# Patient Record
Sex: Female | Born: 1980 | Race: White | Hispanic: No | Marital: Married | Smoking: Never smoker
Health system: Southern US, Community
[De-identification: ages and names within clinical notes are randomized; demographics above are authoritative.]

## PROBLEM LIST (undated history)

## (undated) DIAGNOSIS — J45909 Unspecified asthma, uncomplicated: Secondary | ICD-10-CM

## (undated) DIAGNOSIS — F419 Anxiety disorder, unspecified: Secondary | ICD-10-CM

## (undated) HISTORY — DX: Unspecified asthma, uncomplicated: J45.909

## (undated) HISTORY — DX: Anxiety disorder, unspecified: F41.9

---
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
## 2017-09-26 LAB — HM PAP SMEAR

## 2017-11-03 ENCOUNTER — Encounter: Payer: Self-pay | Admitting: Physician Assistant

## 2017-11-03 ENCOUNTER — Ambulatory Visit: Payer: 59 | Admitting: Physician Assistant

## 2017-11-03 VITALS — BP 131/85 | HR 78 | Resp 14 | Ht 65.5 in | Wt 144.0 lb

## 2017-11-03 DIAGNOSIS — F418 Other specified anxiety disorders: Secondary | ICD-10-CM | POA: Diagnosis not present

## 2017-11-03 DIAGNOSIS — Z7689 Persons encountering health services in other specified circumstances: Secondary | ICD-10-CM

## 2017-11-03 DIAGNOSIS — J45990 Exercise induced bronchospasm: Secondary | ICD-10-CM | POA: Diagnosis not present

## 2017-11-03 DIAGNOSIS — F951 Chronic motor or vocal tic disorder: Secondary | ICD-10-CM

## 2017-11-03 MED ORDER — ALBUTEROL SULFATE HFA 108 (90 BASE) MCG/ACT IN AERS: 1.0000 | INHALATION_SPRAY | RESPIRATORY_TRACT | 5 refills | Status: DC | PRN

## 2017-11-03 MED ORDER — BUSPIRONE HCL 7.5 MG PO TABS: 7.5000 mg | ORAL_TABLET | Freq: Three times a day (TID) | ORAL | 1 refills | Status: DC | PRN

## 2017-11-03 NOTE — Patient Instructions (Addendum)
For anxiety: - Buspar can be taken every 8 hours as needed or you can take it on a scheduled basis three times daily - You can continue Melatonin 3-6 mg at bedtime as needed. Alternatively you can do Diphenhydramine (Benadryl) 25-50 mg at bedtime as needed. Send me a MyChart message in 2-4 weeks to let me know how you are doing on the medication  Counseling Options: - psychologytoday.com: search engine to locate local counselors - Family Services in your county offer counseling on a sliding scale (pay what you can afford) - [facility]: we can place a referral for you to see one of licensed counselors in [place], [place], or [place] - online counseling: BetterHelp and [employer] (not covered by insurance, but affordable self-pay rates)  Other resources: - [place].ateverydayhealth.com/depression/guide/resources/ - [employer] - https://malone.com/greatist.com/grow/resources-when-you-can-not-afford-therapy  Safety Plan: if having self-harm or suicidal thoughts Our Office 970-091-6445(847)205-1791 [facility] Crisis Hotline 843-817-7745310-515-4095 National Suicide Hotline 1-800-SUICIDE If in immediate danger of harming yourself, go to the nearest emergency room or call 911   Living With Anxiety After being diagnosed with an anxiety disorder, you may be relieved to know why you have felt or behaved a certain way. It is natural to also feel overwhelmed about the treatment ahead and what it will mean for your life. With care and support, you can manage this condition and recover from it. How to cope with anxiety Dealing with stress Stress is your body's reaction to life changes and events, both good and bad. Stress can last just a few hours or it can be ongoing. Stress can play a major role in anxiety, so it is important to learn both how to cope with stress and how to think about it differently. Talk with your health care provider or a counselor to learn more about stress reduction. He or she may suggest some stress  reduction techniques, such as:  Music therapy. This can include creating or listening to music that you enjoy and that inspires you.  Mindfulness-based meditation. This involves being aware of your normal breaths, rather than trying to control your breathing. It can be done while sitting or walking.  Centering prayer. This is a kind of meditation that involves focusing on a word, phrase, or sacred image that is meaningful to you and that brings you peace.  Deep breathing. To do this, expand your stomach and inhale slowly through your nose. Hold your breath for 3-5 seconds. Then exhale slowly, allowing your stomach muscles to relax.  Self-talk. This is a skill where you identify thought patterns that lead to anxiety reactions and correct those thoughts.  Muscle relaxation. This involves tensing muscles then relaxing them.  Choose a stress reduction technique that fits your lifestyle and personality. Stress reduction techniques take time and practice. Set aside 5-15 minutes a day to do them. Therapists can offer training in these techniques. The training may be covered by some insurance plans. Other things you can do to manage stress include:  Keeping a stress diary. This can help you learn what triggers your stress and ways to control your response.  Thinking about how you respond to certain situations. You may not be able to control everything, but you can control your reaction.  Making time for activities that help you relax, and not feeling guilty about spending your time in this way.  Therapy combined with coping and stress-reduction skills provides the best chance for successful treatment. Medicines Medicines can help ease symptoms. Medicines for anxiety include:  Anti-anxiety  drugs.  Antidepressants.  Beta-blockers.  Medicines may be used as the main treatment for anxiety disorder, along with therapy, or if other treatments are not working. Medicines should be prescribed by a  health care provider. Relationships Relationships can play a big part in helping you recover. Try to spend more time connecting with trusted friends and family members. Consider going to couples counseling, taking family education classes, or going to family therapy. Therapy can help you and others better understand the condition. How to recognize changes in your condition Everyone has a different response to treatment for anxiety. Recovery from anxiety happens when symptoms decrease and stop interfering with your daily activities at home or work. This may mean that you will start to:  Have better concentration and focus.  Sleep better.  Be less irritable.  Have more energy.  Have improved memory.  It is important to recognize when your condition is getting worse. Contact your health care provider if your symptoms interfere with home or work and you do not feel like your condition is improving. Where to find help and support: You can get help and support from these sources:  Self-help groups.  Online and [employer].  A trusted spiritual leader.  Couples counseling.  Family education classes.  Family therapy.  Follow these instructions at home:  Eat a healthy diet that includes plenty of vegetables, fruits, whole grains, low-fat dairy products, and lean protein. Do not eat a lot of foods that are high in solid fats, added sugars, or salt.  Exercise. Most adults should do the following: ? Exercise for at least 150 minutes each week. The exercise should increase your heart rate and make you sweat (moderate-intensity exercise). ? Strengthening exercises at least twice a week.  Cut down on caffeine, tobacco, alcohol, and other potentially harmful substances.  Get the right amount and quality of sleep. Most adults need 7-9 hours of sleep each night.  Make choices that simplify your life.  Take over-the-counter and prescription medicines only as told by your health  care provider.  Avoid caffeine, alcohol, and certain over-the-counter cold medicines. These may make you feel worse. Ask your pharmacist which medicines to avoid.  Keep all follow-up visits as told by your health care provider. This is important. Questions to ask your health care provider  Would I benefit from therapy?  How often should I follow up with a health care provider?  How long do I need to take medicine?  Are there any long-term side effects of my medicine?  Are there any alternatives to taking medicine? Contact a health care provider if:  You have a hard time staying focused or finishing daily tasks.  You spend many hours a day feeling worried about everyday life.  You become exhausted by worry.  You start to have headaches, feel tense, or have nausea.  You urinate more than normal.  You have diarrhea. Get help right away if:  You have a racing heart and shortness of breath.  You have thoughts of hurting yourself or others. If you ever feel like you may hurt yourself or others, or have thoughts about taking your own life, get help right away. You can go to your nearest emergency department or call:  Your local emergency services (911 in the [place]  A suicide crisis helpline, such as the National Suicide Prevention Lifeline at 431-078-8863. This is open 24-hours a day.  Summary  Taking steps to deal with stress can help calm you.  Medicines cannot  cure anxiety disorders, but they can help ease symptoms.  Family, friends, and partners can play a big part in helping you recover from an anxiety disorder. This information is not intended to replace advice given to you by your health care provider. Make sure you discuss any questions you have with your health care provider. Document Released: 07/01/2016 Document Revised: 07/01/2016 Document Reviewed: 07/01/2016 Elsevier Interactive Patient Education  [employer].

## 2017-11-03 NOTE — Progress Notes (Signed)
HPI:                                                                Arnoldo LenisJennifer Clos is a 37 y.o. female who [facility]: Primary Care Sports Medicine today to establish care  Current concerns: asthma, anxiety  Anxiety: currently working full-time and mother of 36 year old son and 2216 month old daughter. Reports feeling overwhelmed, stressed, irritability, and difficulty focusing. Under added stress with the family's business and mother's recent health issues.   Has never taken medication for anxiety. Has tried counseling in the past.  Reports history of a "movement disorder" that does not have a clear diagnosis, had a negative work-up. Includes blinking and upper extremity involuntary movements.  Denies symptoms of mania/hypomania. Denies suicidal thinking. Denies auditory/visual hallucinations.  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 Uses Albuterol prn. Has Symbicort but does not feel she needs controller medication. Denies nocturnal cough. No exacerbations in the last year. No history of hospitalizations.    Depression screen PHQ 2/9 11/03/2017  Decreased Interest 1  Down, Depressed, Hopeless 0  PHQ - 2 Score 1  Altered sleeping 3  Tired, decreased energy 1  Change in appetite 3  Feeling bad or failure about yourself  0  Trouble concentrating 1  Moving slowly or fidgety/restless 0  Suicidal thoughts 0  PHQ-9 Score 9  Difficult doing work/chores Very difficult    GAD 7 : Generalized Anxiety Score 11/03/2017  Nervous, Anxious, on Edge 2  Control/stop worrying 1  Worry too much - different things 2  Trouble relaxing 1  Restless 0  Easily annoyed or irritable 2  Afraid - awful might happen 1  Total GAD 7 Score 9  Anxiety Difficulty Very difficult      Past Medical History:  Diagnosis Date  . Anxiety   . Asthma    Past Surgical History:  Procedure Laterality Date  . BLADDER SURGERY  1991   Social History    Tobacco Use  . Smoking status: Never Smoker  . Smokeless tobacco: Never Used  Substance Use Topics  . Alcohol use: Yes    Alcohol/week: 0.6 oz    Types: 1 Standard drinks or equivalent per week   family history includes Breast cancer in her mother; Heart disease in her father; Hypertension in her mother; Seizures in her sister.    ROS: Review of Systems  Respiratory: Positive for wheezing.   Gastrointestinal:       + hemorrhoids  Musculoskeletal: Positive for back pain.  Endo/Heme/Allergies: Positive for environmental allergies.  Psychiatric/Behavioral: Negative for depression, hallucinations, memory loss, substance abuse and suicidal ideas. The patient is nervous/anxious and has insomnia.   All other systems reviewed and are negative.    Medications: Current Outpatient Medications  Medication Sig Dispense Refill  . albuterol (VENTOLIN HFA) 108 (90 Base) MCG/ACT inhaler Inhale 1-2 puffs into the lungs every 4 (four) hours as needed for wheezing or shortness of breath. 2 Inhaler 5  . budesonide-formoterol (SYMBICORT) 80-4.5 MCG/ACT inhaler Inhale 2 puffs into the lungs 2 (two) times daily.    . Cetirizine HCl (ZYRTEC PO) Zyrtec    . ibuprofen (ADVIL,MOTRIN) 800 MG tablet ibuprofen 800 mg tablet  TAKE 1 TABLET (800 MG TOTAL)  BY MOUTH EVERY 8 (EIGHT) HOURS AS NEEDED FOR PAIN.    . MELATONIN PO Take by mouth.    . Multiple Vitamins-Minerals (MULTIVITAMIN PO) Take by mouth.    . busPIRone (BUSPAR) 7.5 MG tablet Take 1 tablet (7.5 mg total) by mouth 3 (three) times daily as needed. 90 tablet 1   No current facility-administered medications for this visit.    Allergies  Allergen Reactions  . Azithromycin Hives and Rash  . Erythromycin Rash       Objective:  BP 131/85   Pulse 78   Resp 14   Ht 5' 5.5" (1.664 m)   Wt 144 lb (65.3 kg)   SpO2 98%   BMI 23.60 kg/m  Gen:  alert, not ill-appearing, no distress, appropriate for age HEENT: head normocephalic without  obvious abnormality, conjunctiva and cornea clear, trachea midline Pulm: Normal work of breathing, normal phonation Neuro: alert and oriented x 3, no tremor MSK: extremities atraumatic, normal gait and station Skin: intact, no rashes on exposed skin, no jaundice, no cyanosis Psych: well-groomed, cooperative, good eye contact, appears anxious, affect full range, speech is articulate, and thought processes clear and goal-directed    No results found for this or any previous visit (from the past 72 hour(s)). No results found.    Assessment and Plan: 37 y.o. female with     Encounter to establish care - Personally reviewed PMH, PSH, PFH, medications, allergies, HM - Age-appropriate cancer screening: Pap UTD, to be abstracted - Influenza n/a - Tdap UTD  Situational anxiety - GAD7=9, moderate, no acute safety issues. Discussed treatment options. She is interested in something to take as needed that won't be overly sedating. Starting Buspar tid prn.   Exercise-induced asthma - well-controlled  Encounter to establish care  Situational anxiety - Plan: busPIRone (BUSPAR) 7.5 MG tablet  Chronic motor tic  Exercise-induced asthma - Plan: budesonide-formoterol (SYMBICORT) 80-4.5 MCG/ACT inhaler, albuterol (VENTOLIN HFA) 108 (90 Base) MCG/ACT inhaler    Patient education and anticipatory guidance given Patient agrees with treatment plan Follow-up in 3 months or sooner as needed if symptoms worsen or fail to improve  Levonne Hubert PA-C

## 2017-11-04 ENCOUNTER — Encounter: Payer: Self-pay | Admitting: Physician Assistant

## 2017-12-07 ENCOUNTER — Encounter: Payer: Self-pay | Admitting: Physician Assistant

## 2018-04-09 ENCOUNTER — Emergency Department
Admission: EM | Admit: 2018-04-09 | Discharge: 2018-04-09 | Disposition: A | Discharge status: Home / Self Care | Payer: 59 | Source: Home / Self Care | Attending: Family Medicine | Admitting: Family Medicine

## 2018-04-09 ENCOUNTER — Encounter: Payer: Self-pay | Admitting: Emergency Medicine

## 2018-04-09 ENCOUNTER — Other Ambulatory Visit: Payer: Self-pay

## 2018-04-09 DIAGNOSIS — R319 Hematuria, unspecified: Secondary | ICD-10-CM

## 2018-04-09 DIAGNOSIS — G8929 Other chronic pain: Secondary | ICD-10-CM

## 2018-04-09 DIAGNOSIS — M545 Low back pain, unspecified: Secondary | ICD-10-CM

## 2018-04-09 LAB — POCT URINALYSIS DIP (MANUAL ENTRY)
Bilirubin, UA: NEGATIVE
Blood, UA: NEGATIVE
Glucose, UA: NEGATIVE mg/dL
Ketones, POC UA: NEGATIVE mg/dL
Leukocytes, UA: NEGATIVE
Nitrite, UA: NEGATIVE
Protein Ur, POC: NEGATIVE mg/dL
Spec Grav, UA: 1.025 (ref 1.010–1.025)
Urobilinogen, UA: 0.2 E.U./dL
pH, UA: 6 (ref 5.0–8.0)

## 2018-04-09 NOTE — ED Provider Notes (Addendum)
[name]    CSN: [id number] Arrival date & time: 04/09/18  1632     History   Chief Complaint Chief Complaint  Patient presents with  . Back Pain    HPI Samantha Ochoa is a 37 y.o. female.   HPI  Samantha Ochoa is a 37 y.o. female presenting to UC with c/o bilateral lower back pain that started about 2.5 months ago. She has been going to a physical therapist and getting dry needling done, thinking the pain was due to a muscle strain.  She has not seen much improvement with PT but also became concerned today because she had hematuria.  Denies dysuria or urinary frequency. Denies fever, chills, n/v/d. No vaginal symptoms. No hx of kidney stones.   Past Medical History:  Diagnosis Date  . Anxiety   . Asthma     Patient Active Problem List   Diagnosis Date Noted  . Situational anxiety 11/03/2017  . Exercise-induced asthma 11/03/2017  . 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 BLADDER SURGERY  1991    OB History    Gravida  2   Para  2   Term      Preterm      AB      Living  2     SAB      TAB      Ectopic      Multiple      Live Births               Home Medications    Prior to Admission medications   Medication Sig Start Date End Date Taking? Authorizing Provider  albuterol (VENTOLIN HFA) 108 (90 Base) MCG/ACT inhaler Inhale 1-2 puffs into the lungs every 4 (four) hours as needed for wheezing or shortness of breath. 11/03/17   Carlis Stable, PA-C  budesonide-formoterol [facility]) 80-4.5 MCG/ACT inhaler Inhale 2 puffs into the lungs 2 (two) times daily.    [provider]  busPIRone (BUSPAR) 7.5 MG tablet Take 1 tablet (7.5 mg total) by mouth 3 (three) times daily as needed. 11/03/17   Carlis Stable, PA-C  Cetirizine HCl (ZYRTEC PO) Zyrtec    [provider]  ibuprofen (ADVIL,MOTRIN) 800 MG tablet ibuprofen 800 mg tablet  TAKE 1 TABLET  (800 MG TOTAL) BY MOUTH EVERY 8 (EIGHT) HOURS AS NEEDED FOR PAIN.    [provider]  MELATONIN PO Take by mouth.    [provider]  Multiple Vitamins-Minerals (MULTIVITAMIN PO) Take by mouth.    [provider]    Family History Family History  Problem Relation Age of Onset  . Breast cancer Mother   . Hypertension Mother   . Heart disease Father   . Seizures Sister     Social History Social History   Tobacco Use  . Smoking status: Never Smoker  . Smokeless tobacco: Never Used  Substance Use Topics  . Alcohol use: Yes    Alcohol/week: 1.0 standard drinks    Types: 1 Standard drinks or equivalent per week  . Drug use: Never     Allergies   Azithromycin and Erythromycin   Review of Systems Review of Systems  Gastrointestinal: Negative for abdominal pain, diarrhea, nausea and vomiting.  Genitourinary: Positive for hematuria. Negative for dysuria, flank pain, frequency, pelvic pain, urgency, vaginal bleeding and vaginal discharge.  Musculoskeletal: Positive for back pain and myalgias.  Skin: Negative for rash.  Physical Exam Triage Vital Signs ED Triage Vitals  Enc Vitals Group     BP 04/09/18 1657 114/75     Pulse Rate 04/09/18 1657 71     Resp --      Temp 04/09/18 1657 98 F (36.7 C)     Temp Source 04/09/18 1657 Oral     SpO2 04/09/18 1657 98 %     Weight 04/09/18 1659 150 lb (68 kg)     Height 04/09/18 1659 5\' 5"  (1.651 m)     Head Circumference --      Peak Flow --      Pain Score 04/09/18 1658 10     Pain Loc --      Pain Edu? --      Excl. in GC? --    No data found.  Updated Vital Signs BP 114/75 (BP Location: Right Arm)   Pulse 71   Temp 98 F (36.7 C) (Oral)   Ht 5\' 5"  (1.651 m)   Wt 150 lb (68 kg)   SpO2 98%   BMI 24.96 kg/m   Visual Acuity Right Eye Distance:   Left Eye Distance:   Bilateral Distance:    Right Eye Near:   Left Eye Near:    Bilateral Near:     Physical Exam  Constitutional: She  is oriented to person, place, and time. She appears well-developed and well-nourished. No distress.  HENT:  Head: Normocephalic and atraumatic.  Eyes: EOM are normal.  Neck: Normal range of motion.  Cardiovascular: Normal rate.  Pulmonary/Chest: Effort normal.  Abdominal: Soft. She exhibits no distension. There is tenderness in the suprapubic area. There is no CVA tenderness.  Musculoskeletal: Normal range of motion. She exhibits tenderness. She exhibits no edema.  Lower back muscle tenderness. Tenderness to SI joint without step-off or crepitus.  Negative straight leg raise  Neurological: She is alert and oriented to person, place, and time.  Skin: Skin is warm and dry. Capillary refill takes less than 2 seconds. No rash noted. She is not diaphoretic. No erythema.  Psychiatric: She has a normal mood and affect. Her behavior is normal.  Nursing note and vitals reviewed.    UC Treatments / Results  Labs (all labs ordered are listed, but only abnormal results are displayed) Labs Reviewed  URINE CULTURE  POCT URINALYSIS DIP (MANUAL ENTRY)    EKG None  Radiology No results found.  Procedures Procedures (including critical care time)  Medications Ordered in UC Medications - No data to display  Initial Impression / Assessment and Plan / UC Course  I have reviewed the triage vital signs and the nursing notes.  Pertinent labs & imaging results that were available during my care of the patient were reviewed by me and considered in my medical decision making (see chart for details).     UA: normal Culture sent Offered to get plain films but did question if pt could be pregnant prior to getting plain films.  Pt stated unlikely as she does have an IUD. Pt declined pregnancy test and imaging. She would like to learn results of urine culture first. Offered pt Robaxin for pain. Pt declined.  Encouraged f/u with Sports Medicine and PCP.  Final Clinical Impressions(s) / UC  Diagnoses   Final diagnoses:  Hematuria, unspecified type  Chronic bilateral low back pain without sciatica     Discharge Instructions      Please call to schedule a follow up appointment with your family medicine provider and  Sports Medicine next week for further evaluation and treatment of your chronic low back pain and now the blood in your urine.  A urine culture has been sent to check the severity of your urinary infection and to determine if and antibiotic is needed. The results should come back within 2-3 days and you will be notified even if it comes back normal.  Please stay well hydrated and follow up with your family doctor in 1 week if not improving, sooner if worsening.      ED Prescriptions    None     Controlled Substance Prescriptions [place] Controlled Substance Registry consulted? Not Applicable   Rolla Platehelps, Pearle Wandler O, PA-C 04/09/18 1842    Lurene ShadowPhelps, Ura Hausen O, PA-C 04/09/18 1843

## 2018-04-09 NOTE — Discharge Instructions (Signed)
°  Please call to schedule a follow up appointment with your family medicine provider and Sports Medicine next week for further evaluation and treatment of your chronic low back pain and now the blood in your urine.  A urine culture has been sent to check the severity of your urinary infection and to determine if and antibiotic is needed. The results should come back within 2-3 days and you will be notified even if it comes back normal.  Please stay well hydrated and follow up with your family doctor in 1 week if not improving, sooner if worsening.

## 2018-04-09 NOTE — ED Triage Notes (Signed)
LBP x 2.5 months, this am hematuria

## 2018-04-11 LAB — URINE CULTURE
MICRO NUMBER:: 91132929
SPECIMEN QUALITY:: ADEQUATE

## 2018-04-12 ENCOUNTER — Telehealth: Payer: Self-pay | Admitting: Emergency Medicine

## 2018-08-19 ENCOUNTER — Telehealth: Payer: Self-pay | Admitting: Physician Assistant

## 2018-08-19 DIAGNOSIS — J45990 Exercise induced bronchospasm: Secondary | ICD-10-CM

## 2018-08-19 MED ORDER — ALBUTEROL SULFATE HFA 108 (90 BASE) MCG/ACT IN AERS: 1.0000 | INHALATION_SPRAY | RESPIRATORY_TRACT | 0 refills | Status: DC | PRN

## 2018-08-19 NOTE — Telephone Encounter (Signed)
Samantha Ochoa called this afternoon, stating that she needed refill on the albuterol inhaler. She wasn't sure if she needed to schedule an appt for this. I told her that I would send a message  letting you know. She also wanted to know if she could get a second inhaler to carry in her gym bag.

## 2018-08-19 NOTE — Telephone Encounter (Signed)
Refill sent for 2 inhalers Keep f/u appointment for Monday

## 2018-08-19 NOTE — Addendum Note (Signed)
Addended by: Gena Fray E on: 08/19/2018 05:50 PM   Modules accepted: Orders

## 2018-08-19 NOTE — Telephone Encounter (Signed)
Pt notified that she needs an appointment -EH/RMA

## 2018-08-23 ENCOUNTER — Encounter: Payer: Self-pay | Admitting: Physician Assistant

## 2018-08-23 ENCOUNTER — Ambulatory Visit (INDEPENDENT_AMBULATORY_CARE_PROVIDER_SITE_OTHER): Payer: 59 | Admitting: Physician Assistant

## 2018-08-23 VITALS — BP 111/76 | HR 83 | Resp 14 | Wt 155.0 lb

## 2018-08-23 DIAGNOSIS — F419 Anxiety disorder, unspecified: Secondary | ICD-10-CM

## 2018-08-23 DIAGNOSIS — Z803 Family history of malignant neoplasm of breast: Secondary | ICD-10-CM | POA: Insufficient documentation

## 2018-08-23 DIAGNOSIS — J4530 Mild persistent asthma, uncomplicated: Secondary | ICD-10-CM | POA: Diagnosis not present

## 2018-08-23 DIAGNOSIS — Z23 Encounter for immunization: Secondary | ICD-10-CM | POA: Diagnosis not present

## 2018-08-23 MED ORDER — BUDESONIDE-FORMOTEROL FUMARATE 80-4.5 MCG/ACT IN AERO: 2.0000 | INHALATION_SPRAY | Freq: Two times a day (BID) | RESPIRATORY_TRACT | 5 refills | Status: DC

## 2018-08-23 MED ORDER — BUSPIRONE HCL 7.5 MG PO TABS: 7.5000 mg | ORAL_TABLET | Freq: Two times a day (BID) | ORAL | 2 refills | Status: DC

## 2018-08-23 MED ORDER — ALBUTEROL SULFATE HFA 108 (90 BASE) MCG/ACT IN AERS: 1.0000 | INHALATION_SPRAY | RESPIRATORY_TRACT | 5 refills | Status: DC | PRN

## 2018-08-23 NOTE — Patient Instructions (Addendum)
Send a MyChart message in about 2 weeks regarding anxiety symptoms  Plan to follow-up in the office in the next 2 months  Living With Anxiety  After being diagnosed with an anxiety disorder, you may be relieved to know why you have felt or behaved a certain way. It is natural to also feel overwhelmed about the treatment ahead and what it will mean for your life. With care and support, you can manage this condition and recover from it. How to cope with anxiety Dealing with stress Stress is your body's reaction to life changes and events, both good and bad. Stress can last just a few hours or it can be ongoing. Stress can play a major role in anxiety, so it is important to learn both how to cope with stress and how to think about it differently. Talk with your health care provider or a counselor to learn more about stress reduction. He or she may suggest some stress reduction techniques, such as:  Music therapy. This can include creating or listening to music that you enjoy and that inspires you.  Mindfulness-based meditation. This involves being aware of your normal breaths, rather than trying to control your breathing. It can be done while sitting or walking.  Centering prayer. This is a kind of meditation that involves focusing on a word, phrase, or sacred image that is meaningful to you and that brings you peace.  Deep breathing. To do this, expand your stomach and inhale slowly through your nose. Hold your breath for 3-5 seconds. Then exhale slowly, allowing your stomach muscles to relax.  Self-talk. This is a skill where you identify thought patterns that lead to anxiety reactions and correct those thoughts.  Muscle relaxation. This involves tensing muscles then relaxing them. Choose a stress reduction technique that fits your lifestyle and personality. Stress reduction techniques take time and practice. Set aside 5-15 minutes a day to do them. Therapists can offer training in these  techniques. The training may be covered by some insurance plans. Other things you can do to manage stress include:  Keeping a stress diary. This can help you learn what triggers your stress and ways to control your response.  Thinking about how you respond to certain situations. You may not be able to control everything, but you can control your reaction.  Making time for activities that help you relax, and not feeling guilty about spending your time in this way. Therapy combined with coping and stress-reduction skills provides the best chance for successful treatment. Medicines Medicines can help ease symptoms. Medicines for anxiety include:  Anti-anxiety drugs.  Antidepressants.  Beta-blockers. Medicines may be used as the main treatment for anxiety disorder, along with therapy, or if other treatments are not working. Medicines should be prescribed by a health care provider. Relationships Relationships can play a big part in helping you recover. Try to spend more time connecting with trusted friends and family members. Consider going to couples counseling, taking family education classes, or going to family therapy. Therapy can help you and others better understand the condition. How to recognize changes in your condition Everyone has a different response to treatment for anxiety. Recovery from anxiety happens when symptoms decrease and stop interfering with your daily activities at home or work. This may mean that you will start to:  Have better concentration and focus.  Sleep better.  Be less irritable.  Have more energy.  Have improved memory. It is important to recognize when your condition is getting worse.  Contact your health care provider if your symptoms interfere with home or work and you do not feel like your condition is improving. Where to find help and support: You can get help and support from these sources:  Self-help groups.  Online and [employer]  organizations.  A trusted spiritual leader.  Couples counseling.  Family education classes.  Family therapy. Follow these instructions at home:  Eat a healthy diet that includes plenty of vegetables, fruits, whole grains, low-fat dairy products, and lean protein. Do not eat a lot of foods that are high in solid fats, added sugars, or salt.  Exercise. Most adults should do the following: ? Exercise for at least 150 minutes each week. The exercise should increase your heart rate and make you sweat (moderate-intensity exercise). ? Strengthening exercises at least twice a week.  Cut down on caffeine, tobacco, alcohol, and other potentially harmful substances.  Get the right amount and quality of sleep. Most adults need 7-9 hours of sleep each night.  Make choices that simplify your life.  Take over-the-counter and prescription medicines only as told by your health care provider.  Avoid caffeine, alcohol, and certain over-the-counter cold medicines. These may make you feel worse. Ask your pharmacist which medicines to avoid.  Keep all follow-up visits as told by your health care provider. This is important. Questions to ask your health care provider  Would I benefit from therapy?  How often should I follow up with a health care provider?  How long do I need to take medicine?  Are there any long-term side effects of my medicine?  Are there any alternatives to taking medicine? Contact a health care provider if:  You have a hard time staying focused or finishing daily tasks.  You spend many hours a day feeling worried about everyday life.  You become exhausted by worry.  You start to have headaches, feel tense, or have nausea.  You urinate more than normal.  You have diarrhea. Get help right away if:  You have a racing heart and shortness of breath.  You have thoughts of hurting yourself or others. If you ever feel like you may hurt yourself or others, or have  thoughts about taking your own life, get help right away. You can go to your nearest emergency department or call:  Your local emergency services (911 in the [place]  A suicide crisis helpline, such as the National Suicide Prevention Lifeline at (857)673-40601-6017166791. This is open 24-hours a day. Summary  Taking steps to deal with stress can help calm you.  Medicines cannot cure anxiety disorders, but they can help ease symptoms.  Family, friends, and partners can play a big part in helping you recover from an anxiety disorder. This information is not intended to replace advice given to you by your health care provider. Make sure you discuss any questions you have with your health care provider. Document Released: 07/01/2016 Document Revised: 07/01/2016 Document Reviewed: 07/01/2016 Elsevier Interactive Patient Education  2019 [employer]

## 2018-08-23 NOTE — Progress Notes (Signed)
HPI:                                                                Samantha Ochoa is a 38 y.o. female who presents to [facility] today for follow-up  Anxiety: has been 10 months since last office visit. She was prescribed Buspar, but discontinued this after about a month because she did not feel it was helpful with anxiety. She was taking 7.5 mg as needed, about once per week.  Her mother recently passed away from breast cancer in January. States she is "grieving well." Reports drinks on average 1 beer per week. Notices that her motor tics are better when she has a beer. Rarely takes Melatonin for insomnia.  Asthma: requesting refills of Symbicort and Albuterol. Denies exacerbations in the last year. Denies nocturnal symptoms.  Depression screen [facility] 2/9 08/23/2018 11/03/2017  Decreased Interest 0 1  Down, Depressed, Hopeless 0 0  PHQ - 2 Score 0 1  Altered sleeping 0 3  Tired, decreased energy 0 1  Change in appetite 0 3  Feeling bad or failure about yourself  0 0  Trouble concentrating 0 1  Moving slowly or fidgety/restless 0 0  Suicidal thoughts 0 0  PHQ-9 Score 0 9  Difficult doing work/chores Not difficult at all Very difficult    GAD 7 : Generalized Anxiety Score 08/23/2018 11/03/2017  Nervous, Anxious, on Edge 1 2  Control/stop worrying 0 1  Worry too much - different things 0 2  Trouble relaxing 0 1  Restless 0 0  Easily annoyed or irritable 1 2  Afraid - awful might happen 0 1  Total GAD 7 Score 2 9  Anxiety Difficulty Somewhat difficult Very difficult      Past Medical History:  Diagnosis Date  . Anxiety   . Asthma    Past Surgical History:  Procedure Laterality Date  . BLADDER SURGERY  1991   Social History   Tobacco Use  . Smoking status: Never Smoker  . Smokeless tobacco: Never Used  Substance Use Topics  . Alcohol use: Yes    Alcohol/week: 1.0 standard drinks    Types: 1 Standard drinks or  equivalent per week   family history includes Breast cancer in her mother; Heart disease in her father; Hypertension in her mother; Seizures in her sister.    ROS: negative except as noted in the HPI  Medications: Current Outpatient Medications  Medication Sig Dispense Refill  . albuterol (VENTOLIN HFA) 108 (90 Base) MCG/ACT inhaler Inhale 1-2 puffs into the lungs every 4 (four) hours as needed for wheezing or shortness of breath. 2 Inhaler 5  . budesonide-formoterol (SYMBICORT) 80-4.5 MCG/ACT inhaler Inhale 2 puffs into the lungs 2 (two) times daily. 1 Inhaler 5  . busPIRone (BUSPAR) 7.5 MG tablet Take 1 tablet (7.5 mg total) by mouth 2 (two) times daily. 60 tablet 2  . Cetirizine HCl (ZYRTEC PO) Zyrtec    . ibuprofen (ADVIL,MOTRIN) 800 MG tablet ibuprofen 800 mg tablet  TAKE 1 TABLET (800 MG TOTAL) BY MOUTH EVERY 8 (EIGHT) HOURS AS NEEDED FOR PAIN.    . MELATONIN PO Take by mouth.    . Multiple Vitamins-Minerals (MULTIVITAMIN PO) Take by mouth.     No  current facility-administered medications for this visit.    Allergies  Allergen Reactions  . Azithromycin Hives and Rash  . Erythromycin Rash     Objective:  BP 111/76   Pulse 83   Resp 14   Wt 155 lb (70.3 kg)   SpO2 98%   BMI 25.79 kg/m  Gen:  alert, not ill-appearing, no distress, appropriate for age HEENT: head normocephalic without obvious abnormality, conjunctiva and cornea clear, trachea midline Pulm: Normal work of breathing, normal phonation, clear to auscultation bilaterally, no wheezes, rales or rhonchi CV: Normal rate, regular rhythm, s1 and s2 distinct, no murmurs, clicks or rubs  Neuro: alert and oriented x 3, no tremor MSK: extremities atraumatic, normal gait and station, occasional involuntary motor tic of eyelids Skin: intact, no rashes on exposed skin, no jaundice, no cyanosis Psych: well-groomed, cooperative, good eye contact, euthymic mood, affect mood-congruent, speech is articulate, and thought  processes clear and goal-directed, normal judgement, good insight    No results found for this or any previous visit (from the past 72 hour(s)). No results found.    Assessment and Plan: 38 y.o. female with   .Victorino DikeJennifer was seen today for medication management.  Diagnoses and all orders for this visit:  Anxiety disorder, unspecified type -     busPIRone (BUSPAR) 7.5 MG tablet; Take 1 tablet (7.5 mg total) by mouth 2 (two) times daily.  Mild persistent reactive airway disease without complication -     albuterol (VENTOLIN HFA) 108 (90 Base) MCG/ACT inhaler; Inhale 1-2 puffs into the lungs every 4 (four) hours as needed for wheezing or shortness of breath. -     budesonide-formoterol (SYMBICORT) 80-4.5 MCG/ACT inhaler; Inhale 2 puffs into the lungs 2 (two) times daily.  Family history of breast cancer in mother  Need for immunization against influenza -     Flu Vaccine QUAD 36+ mos IM   Anxiety disorder GAD7=2, mild, symptoms rated as somewhat difficult Declined referral for CBT Recommend scheduling Buspar twice daily rather than taking as needed with option to take an additional dose as needed  Family hx of breast cancer in mother Provided with Tyrer-Cuzick questionairre to complete. She is planning genetic testing through her GYN's office. Informed we could complete this here for her if needed  Asthma Mild, persistent, well controlled Pulse ox 98% on RA No recent exacerbations Refills provided Influenza vaccine given today    Patient education and anticipatory guidance given Patient agrees with treatment plan Follow-up in 2 months for anxiety or sooner as needed if symptoms worsen or fail to improve  Levonne Hubertharley E. Madesyn Ast PA-C

## 2018-09-01 ENCOUNTER — Encounter: Payer: Self-pay | Admitting: Physician Assistant

## 2018-10-06 ENCOUNTER — Encounter: Payer: Self-pay | Admitting: Physician Assistant

## 2018-10-22 ENCOUNTER — Ambulatory Visit: Payer: 59 | Admitting: Physician Assistant

## 2018-10-26 ENCOUNTER — Other Ambulatory Visit: Payer: Self-pay

## 2018-10-26 ENCOUNTER — Emergency Department (INDEPENDENT_AMBULATORY_CARE_PROVIDER_SITE_OTHER)
Admission: EM | Admit: 2018-10-26 | Discharge: 2018-10-26 | Disposition: A | Discharge status: Home / Self Care | Payer: 59 | Source: Home / Self Care

## 2018-10-26 DIAGNOSIS — M79605 Pain in left leg: Secondary | ICD-10-CM

## 2018-10-26 DIAGNOSIS — T304 Corrosion of unspecified body region, unspecified degree: Secondary | ICD-10-CM

## 2018-10-26 DIAGNOSIS — M79604 Pain in right leg: Secondary | ICD-10-CM

## 2018-10-26 MED ORDER — IBUPROFEN 600 MG PO TABS
600.0000 mg | ORAL_TABLET | Freq: Once | ORAL | Status: AC
  Administered 2018-10-26: 600 mg via ORAL

## 2018-10-26 MED ORDER — SILVER SULFADIAZINE 1 % EX CREA: 1.0000 "application " | TOPICAL_CREAM | Freq: Every day | CUTANEOUS | 0 refills | Status: DC

## 2018-10-26 NOTE — Discharge Instructions (Signed)
°  You may take 500mg  acetaminophen every 4-6 hours or in combination with ibuprofen 400-600mg  every 6-8 hours as needed for pain and inflammation.  You may also use a cool damp washcloth or warm damp washcloth to help ease muscle soreness. Be sure to gradually warm up and stretch before and after workouts to help prevent sore muscles.  If you develop severe muscle pain, peeing dark urine, develop nausea, vomiting, or other new concerning symptoms, please call 911 or go to the hospital for immediate evaluation and treatment. These could be signs of muscle breakdown due to recent intense workout.

## 2018-10-26 NOTE — ED Provider Notes (Addendum)
[facility]    CSN: [id number] Arrival date & time: 10/26/18  1511     History   Chief Complaint Chief Complaint  Patient presents with  . chemical burn    HPI Samantha Ochoa is a 38 y.o. female.   HPI  Samantha Ochoa is a 38 y.o. female presenting to UC with c/o bilateral upper leg burning that started just PTA. Pt has been doing a new workout routine and was sprinting this morning. Her muscles started to feel tight so she applied topical Biofreeze around 11/12AM, she was doing okay but then she developed severe burning of her skin and developed redness of her skin just 30 minutes PTA.  She tried to rinse her legs at home but pain was too severe. No pain medication taken PTA. Denies any other symptoms including no nausea, vomiting, or dark urine. She has used BioFreeze in the past w/o reaction. She did shave her legs yesterday and wonders if that could be contributing.    Past Medical History:  Diagnosis Date  . Anxiety   . Asthma     Patient Active Problem List   Diagnosis Date Noted  . Family history of breast cancer in mother 08/23/2018  . Anxiety disorder 11/03/2017  . Exercise-induced asthma 11/03/2017  . 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 BLADDER SURGERY  1991    OB History    Gravida  2   Para  2   Term      Preterm      AB      Living  2     SAB      TAB      Ectopic      Multiple      Live Births               Home Medications    Prior to Admission medications   Medication Sig Start Date End Date Taking? Authorizing Provider  albuterol (VENTOLIN HFA) 108 (90 Base) MCG/ACT inhaler Inhale 1-2 puffs into the lungs every 4 (four) hours as needed for wheezing or shortness of breath. 08/23/18   Carlis Stable, PA-C  budesonide-formoterol [facility]) 80-4.5 MCG/ACT inhaler Inhale 2 puffs into the lungs 2 (two) times daily. 08/23/18   Carlis Stable,  PA-C  busPIRone (BUSPAR) 7.5 MG tablet Take 1 tablet (7.5 mg total) by mouth 2 (two) times daily. 08/23/18   Carlis Stable, PA-C  Cetirizine HCl (ZYRTEC PO) Zyrtec    [provider]  ibuprofen (ADVIL,MOTRIN) 800 MG tablet ibuprofen 800 mg tablet  TAKE 1 TABLET (800 MG TOTAL) BY MOUTH EVERY 8 (EIGHT) HOURS AS NEEDED FOR PAIN.    [provider]  MELATONIN PO Take by mouth.    [provider]  Multiple Vitamins-Minerals (MULTIVITAMIN PO) Take by mouth.    [provider]  silver sulfADIAZINE (SILVADENE) 1 % cream Apply 1 application topically daily. For up to 7 days. 10/26/18   Lurene Shadow, PA-C    Family History Family History  Problem Relation Age of Onset  . Breast cancer Mother   . Hypertension Mother   . Heart disease Father   . Seizures Sister     Social History Social History   Tobacco Use  . Smoking status: Never Smoker  . Smokeless tobacco: Never Used  Substance Use Topics  . Alcohol use: Yes    Alcohol/week: 1.0 standard drinks    Types:  1 Standard drinks or equivalent per week  . Drug use: Never     Allergies   Azithromycin and Erythromycin   Review of Systems Review of Systems  Musculoskeletal: Positive for myalgias. Negative for arthralgias and joint swelling.  Skin: Positive for rash. Negative for wound.     Physical Exam  No data found.  Updated Vital Signs BP 113/77   Pulse 66   Temp 97.6 F (36.4 C) (Oral)   Resp (!) 24   Wt 152 lb (68.9 kg)   SpO2 99%   BMI 25.29 kg/m   Visual Acuity Right Eye Distance:   Left Eye Distance:   Bilateral Distance:    Right Eye Near:   Left Eye Near:    Bilateral Near:     Physical Exam Vitals signs and nursing note reviewed.  Constitutional:      Appearance: She is well-developed.  HENT:     Head: Normocephalic and atraumatic.  Neck:     Musculoskeletal: Normal range of motion.  Cardiovascular:     Rate and Rhythm: Normal rate.  Pulmonary:      Effort: Pulmonary effort is normal.  Musculoskeletal: Normal range of motion.     Comments: Full ROM upper and lower extremities. No edema. Muscle compartments are soft, non-tender. Only tenderness is over erythematous patches of skin on thighs and medial knees.   Skin:    General: Skin is warm and dry.     Findings: Erythema present. No bruising.          Comments: Diffuse erythema on upper legs. Tenderness to light touch. No edema, induration or fluctuance. No blistering.   Neurological:     Mental Status: She is alert and oriented to person, place, and time.  Psychiatric:        Behavior: Behavior normal.      UC Treatments / Results  Labs (all labs ordered are listed, but only abnormal results are displayed) Labs Reviewed - No data to display  EKG None  Radiology No results found.  Procedures Procedures (including critical care time)  Medications Ordered in UC Medications  ibuprofen (ADVIL,MOTRIN) tablet 600 mg (600 mg Oral Given 10/26/18 1539)    Initial Impression / Assessment and Plan / UC Course  I have reviewed the triage vital signs and the nursing notes.  Pertinent labs & imaging results that were available during my care of the patient were reviewed by me and considered in my medical decision making (see chart for details).     Hx and exam c/w superficial chemical burn of upper legs. Luke warm damp wash cloths were applied to legs and provided moderate relief. Ibuprofen  given orally in UC. Skin gently cleansed with Hibiclens, rinsed then pat dried. Silvadene and curlex bandage applied for comfort No evidence of rhabdomyolysis as this time. Discussed symptoms that warrant emergent care in the ED.   Final Clinical Impressions(s) / UC Diagnoses   Final diagnoses:  Chemical burn of skin  Bilateral leg pain     Discharge Instructions      You may take  acetaminophen every 4-6 hours or in combination with ibuprofen 400-600mg  every 6-8  hours as needed for pain and inflammation.  You may also use a cool damp washcloth or warm damp washcloth to help ease muscle soreness. Be sure to gradually warm up and stretch before and after workouts to help prevent sore muscles.  If you develop severe muscle pain, peeing dark urine, develop nausea, vomiting, or other new  concerning symptoms, please call 911 or go to the hospital for immediate evaluation and treatment. These could be signs of muscle breakdown due to recent intense workout.       ED Prescriptions    Medication Sig Dispense Auth. Provider   silver sulfADIAZINE (SILVADENE) 1 % cream Apply 1 application topically daily. For up to 7 days. 50 g Lurene ShadowPhelps, Leylanie Woodmansee O, PA-C     Controlled Substance Prescriptions [place] Controlled Substance Registry consulted? Not Applicable   Rolla Platehelps, Adellyn Capek O, PA-C 10/26/18 1603    Lurene ShadowPhelps, Nicklas Mcsweeney O, [place]PA-C 10/26/18 [phone]

## 2018-10-26 NOTE — ED Triage Notes (Signed)
Pt has been doing a 28 day challenge workout, and legs were hurting this am so around noon put bio-freeze on legs.  No pain for about 2-3 hours, then severe burning and redness.

## 2018-10-28 ENCOUNTER — Telehealth: Payer: Self-pay

## 2018-10-28 ENCOUNTER — Encounter: Payer: Self-pay | Admitting: Physician Assistant

## 2018-10-28 ENCOUNTER — Telehealth (INDEPENDENT_AMBULATORY_CARE_PROVIDER_SITE_OTHER): Payer: 59 | Admitting: Physician Assistant

## 2018-10-28 DIAGNOSIS — J45990 Exercise induced bronchospasm: Secondary | ICD-10-CM

## 2018-10-28 DIAGNOSIS — F418 Other specified anxiety disorders: Secondary | ICD-10-CM | POA: Diagnosis not present

## 2018-10-28 MED ORDER — ALBUTEROL SULFATE HFA 108 (90 BASE) MCG/ACT IN AERS: 1.0000 | INHALATION_SPRAY | RESPIRATORY_TRACT | 5 refills | Status: DC | PRN

## 2018-10-28 NOTE — Telephone Encounter (Addendum)
Called patient today as a recheck from her visit on 10-26-2018, stated within a few hours after the visit that the pain had gone away and is doing ok right now.  Informed her that if she needs to come back again.

## 2018-10-28 NOTE — Progress Notes (Signed)
Virtual Visit via Video Note  I connected with Samantha LenisJennifer Schollmeyer on 10/28/18 at  1:40 PM EDT by a video enabled telemedicine application and verified that I am speaking with the correct person using two identifiers.   I discussed the limitations of evaluation and management by telemedicine and the availability of in person appointments. The patient expressed understanding and agreed to proceed.  History of Present Illness:   Anxiety - reports second trial of Buspar was not successful. Medication "made me feel weird." She discontinued it after a couple of weeks. She reports she has had less anxiety symptoms lately, but admits she has been feeling more depressed on an intermittent basis. Also reports several times having the thought of being better off dead. She denies any self-harm or plan and states that she would not hurt herself because her children need her. She recently started an exercise program 6 days per week and has been doing this for about 2 weeks. Has noticed this has helped with anxiety. She is interested in non-medication options for managing her depression.  [facility] 10/28/2018 08/23/2018 11/03/2017  Decreased Interest 0 0 1  Down, Depressed, Hopeless 2 0 0  PHQ - 2 Score 2 0 1  Altered sleeping 0 0 3  Tired, decreased energy 0 0 1  Change in appetite 0 0 3  Feeling bad or failure about yourself  0 0 0  Trouble concentrating 0 0 1  Moving slowly or fidgety/restless 0 0 0  Suicidal thoughts 2 0 0  PHQ-9 Score 4 0 9  Difficult doing work/chores - Not difficult at all Very difficult    GAD 7 : Generalized Anxiety Score 10/28/2018 08/23/2018 11/03/2017  Nervous, Anxious, on Edge 1 1 2   Control/stop worrying 1 0 1  Worry too much - different things 1 0 2  Trouble relaxing 0 0 1  Restless 0 0 0  Easily annoyed or irritable 1 1 2   Afraid - awful might happen 0 0 1  Total GAD 7 Score 4 2 9   Anxiety Difficulty - Somewhat difficult Very difficult      Past  Medical History:  Diagnosis Date  . Anxiety   . Asthma    Past Surgical History:  Procedure Laterality Date  . BLADDER SURGERY  1991   Social History   Tobacco Use  . Smoking status: Never Smoker  . Smokeless tobacco: Never Used  Substance Use Topics  . Alcohol use: Yes    Alcohol/week: 1.0 standard drinks    Types: 1 Standard drinks or equivalent per week   family history includes Breast cancer in her mother; Heart disease in her father; Hypertension in her mother; Seizures in her sister.    ROS: negative except as noted in the HPI  Medications: Current Outpatient Medications  Medication Sig Dispense Refill  . albuterol (VENTOLIN HFA) 108 (90 Base) MCG/ACT inhaler Inhale 1-2 puffs into the lungs every 4 (four) hours as needed for wheezing or shortness of breath. 2 Inhaler 5  . budesonide-formoterol (SYMBICORT) 80-4.5 MCG/ACT inhaler Inhale 2 puffs into the lungs 2 (two) times daily. 1 Inhaler 5  . busPIRone (BUSPAR) 7.5 MG tablet Take 1 tablet (7.5 mg total) by mouth 2 (two) times daily. (Patient not taking: Reported on 10/28/2018) 60 tablet 2  . Cetirizine HCl (ZYRTEC PO) Zyrtec    . ibuprofen (ADVIL,MOTRIN) 800 MG tablet ibuprofen 800 mg tablet  TAKE 1 TABLET (800 MG TOTAL) BY MOUTH EVERY 8 (EIGHT) HOURS AS NEEDED FOR  PAIN.    Marland Kitchen MELATONIN PO Take by mouth.    . Multiple Vitamins-Minerals (MULTIVITAMIN PO) Take by mouth.     No current facility-administered medications for this visit.    Allergies  Allergen Reactions  . Azithromycin Hives and Rash  . Erythromycin Rash       Objective:  There were no vitals taken for this visit. Gen:  alert, not ill-appearing, no distress, appropriate for age HEENT: head normocephalic without obvious abnormality, conjunctiva and cornea clear, trachea midline Pulm: Normal work of breathing, normal phonation  Neuro: alert and oriented x 3 MSK: extremities atraumatic, normal gait and station Psych: well-groomed, cooperative, good  eye contact, depressed mood, affect mood-congruent, speech is articulate, thought processes clear and goal-directed, normal judgment, good insight, no SI    No results found for this or any previous visit (from the past 72 hour(s)). No results found.    Assessment and Plan: 38 y.o. female with   .Diagnoses and all orders for this visit:  Anxiety with depression -     Ambulatory referral to Psychology  Exercise-induced asthma -     albuterol (VENTOLIN HFA) 108 (90 Base) MCG/ACT inhaler; Inhale 1-2 puffs into the lungs every 4 (four) hours as needed for wheezing or shortness of breath.     PHQ9=4 Does not meet diagnostic criteria for major or minor depression but does endorse brief episodes of passive SI Discussed treatment options including CBT/counseling and moderate-intensity exercise Provided her with info via MyChart on herbal/dietary supplements for depression We also discussed SSRI mechanism of action, potential AE, onset of action and follow-up Patient states she would like to start with non-medication options first  Referral placed to [facility] for counseling Follow-up in 1 month  Follow Up Instructions:    I discussed the assessment and treatment plan with the patient. The patient was provided an opportunity to ask questions and all were answered. The patient agreed with the plan and demonstrated an understanding of the instructions.   The patient was advised to call back or seek an in-person evaluation if the symptoms worsen or if the condition fails to improve as anticipated.  I provided 15 minutes of non-face-to-face time during this encounter.   Carlis Stable, [place]

## 2018-12-07 ENCOUNTER — Ambulatory Visit (INDEPENDENT_AMBULATORY_CARE_PROVIDER_SITE_OTHER): Payer: 59 | Admitting: Psychology

## 2018-12-07 DIAGNOSIS — F411 Generalized anxiety disorder: Secondary | ICD-10-CM | POA: Diagnosis not present

## 2018-12-07 DIAGNOSIS — F4321 Adjustment disorder with depressed mood: Secondary | ICD-10-CM | POA: Diagnosis not present

## 2018-12-28 ENCOUNTER — Ambulatory Visit (INDEPENDENT_AMBULATORY_CARE_PROVIDER_SITE_OTHER): Payer: 59 | Admitting: Psychology

## 2018-12-28 DIAGNOSIS — F411 Generalized anxiety disorder: Secondary | ICD-10-CM

## 2018-12-28 DIAGNOSIS — F4321 Adjustment disorder with depressed mood: Secondary | ICD-10-CM

## 2019-01-11 ENCOUNTER — Ambulatory Visit (INDEPENDENT_AMBULATORY_CARE_PROVIDER_SITE_OTHER): Payer: 59 | Admitting: Psychology

## 2019-01-11 DIAGNOSIS — F4321 Adjustment disorder with depressed mood: Secondary | ICD-10-CM | POA: Diagnosis not present

## 2019-01-11 DIAGNOSIS — F411 Generalized anxiety disorder: Secondary | ICD-10-CM

## 2019-01-25 ENCOUNTER — Ambulatory Visit (INDEPENDENT_AMBULATORY_CARE_PROVIDER_SITE_OTHER): Payer: 59 | Admitting: Psychology

## 2019-01-25 DIAGNOSIS — F4321 Adjustment disorder with depressed mood: Secondary | ICD-10-CM

## 2019-01-25 DIAGNOSIS — F411 Generalized anxiety disorder: Secondary | ICD-10-CM | POA: Diagnosis not present

## 2019-02-01 ENCOUNTER — Encounter: Payer: Self-pay | Admitting: Physician Assistant

## 2019-02-15 ENCOUNTER — Ambulatory Visit: Payer: Self-pay | Admitting: Psychology

## 2019-03-01 ENCOUNTER — Ambulatory Visit (INDEPENDENT_AMBULATORY_CARE_PROVIDER_SITE_OTHER): Payer: 59 | Admitting: Psychology

## 2019-03-01 DIAGNOSIS — F411 Generalized anxiety disorder: Secondary | ICD-10-CM | POA: Diagnosis not present

## 2019-03-01 DIAGNOSIS — F4321 Adjustment disorder with depressed mood: Secondary | ICD-10-CM | POA: Diagnosis not present

## 2019-03-15 ENCOUNTER — Ambulatory Visit: Payer: Self-pay | Admitting: Psychology

## 2019-03-29 ENCOUNTER — Ambulatory Visit (INDEPENDENT_AMBULATORY_CARE_PROVIDER_SITE_OTHER): Payer: 59 | Admitting: Psychology

## 2019-03-29 DIAGNOSIS — F4321 Adjustment disorder with depressed mood: Secondary | ICD-10-CM | POA: Diagnosis not present

## 2019-03-29 DIAGNOSIS — F411 Generalized anxiety disorder: Secondary | ICD-10-CM | POA: Diagnosis not present

## 2019-04-07 ENCOUNTER — Telehealth (INDEPENDENT_AMBULATORY_CARE_PROVIDER_SITE_OTHER): Payer: 59 | Admitting: Physician Assistant

## 2019-04-07 ENCOUNTER — Encounter: Payer: Self-pay | Admitting: Physician Assistant

## 2019-04-07 DIAGNOSIS — F418 Other specified anxiety disorders: Secondary | ICD-10-CM | POA: Diagnosis not present

## 2019-04-07 DIAGNOSIS — J309 Allergic rhinitis, unspecified: Secondary | ICD-10-CM | POA: Diagnosis not present

## 2019-04-07 DIAGNOSIS — J45990 Exercise induced bronchospasm: Secondary | ICD-10-CM | POA: Diagnosis not present

## 2019-04-07 MED ORDER — LEVOCETIRIZINE DIHYDROCHLORIDE 5 MG PO TABS: 5.0000 mg | ORAL_TABLET | Freq: Every evening | ORAL | 3 refills | Status: DC

## 2019-04-07 MED ORDER — ALBUTEROL SULFATE HFA 108 (90 BASE) MCG/ACT IN AERS: 1.0000 | INHALATION_SPRAY | RESPIRATORY_TRACT | 3 refills | Status: DC | PRN

## 2019-04-07 MED ORDER — ESCITALOPRAM OXALATE 5 MG PO TABS: ORAL_TABLET | ORAL | 1 refills | Status: DC

## 2019-04-07 NOTE — Progress Notes (Signed)
Virtual Visit via Video Note  I connected with Samantha Ochoa on 04/07/19 at  1:00 PM EDT by a video enabled telemedicine application and verified that I am speaking with the correct person using two identifiers.   I discussed the limitations of evaluation and management by telemedicine and the availability of in person appointments. The patient expressed understanding and agreed to proceed.  History of Present Illness: HPI:                                                                Samantha Ochoa is a 38 y.o. female   CC: medication managment  Depression: She was seeing a counselor for several months Reports she has been feeling angry and irritable the last 2 weeks. Reports vigorous exercise does help, but she does not have time to exercise more than Wakes up feeling exhausted  Asthma: reports she was sick with a cough about 4-5 weeks ago and was tested for COVID-19. Test was negative, but she felt like she had the symptoms. She states she has needed to use the Symbicort 2 puffs once daily for the last 3-4 weeks. She also uses her Albuterol twice a day.  She has also been bothered by allergy symptoms, but states antihistamines make her feel more tired, which makes her more irritable so she is not taking anything.   Depression screen [facility] 2/9 04/07/2019 10/28/2018 08/23/2018 11/03/2017  Decreased Interest 0 0 0 1  Down, Depressed, Hopeless 1 2 0 0  PHQ - 2 Score 1 2 0 1  Altered sleeping 0 0 0 3  Tired, decreased energy 3 0 0 1  Change in appetite 0 0 0 3  Feeling bad or failure about yourself  1 0 0 0  Trouble concentrating 1 0 0 1  Moving slowly or fidgety/restless 0 0 0 0  Suicidal thoughts 0 2 0 0  PHQ-9 Score 6 4 0 9  Difficult doing work/chores - - Not difficult at all Very difficult    GAD 7 : Generalized Anxiety Score 04/07/2019 10/28/2018 08/23/2018 11/03/2017  Nervous, Anxious, on Edge 2 1 1 2   Control/stop worrying 1 1 0 1  Worry too much - different things 1 1 0 2   Trouble relaxing 2 0 0 1  Restless 1 0 0 0  Easily annoyed or irritable 3 1 1 2   Afraid - awful might happen 0 0 0 1  Total GAD 7 Score 10 4 2 9   Anxiety Difficulty - - Somewhat difficult Very difficult      Past Medical History:  Diagnosis Date  . Anxiety   . Asthma    Past Surgical History:  Procedure Laterality Date  . BLADDER SURGERY  1991   Social History   Tobacco Use  . Smoking status: Never Smoker  . Smokeless tobacco: Never Used  Substance Use Topics  . Alcohol use: Yes    Alcohol/week: 1.0 standard drinks    Types: 1 Standard drinks or equivalent per week   family history includes Breast cancer in her mother; Heart disease in her father; Hypertension in her mother; Seizures in her sister.    ROS: negative except as noted in the HPI  Medications: Current Outpatient Medications  Medication Sig Dispense Refill  . albuterol (VENTOLIN  HFA) 108 (90 Base) MCG/ACT inhaler Inhale 1-2 puffs into the lungs every 4 (four) hours as needed for wheezing or shortness of breath. 2 Inhaler 5  . budesonide-formoterol (SYMBICORT) 80-4.5 MCG/ACT inhaler Inhale 2 puffs into the lungs 2 (two) times daily. 1 Inhaler 5  . Cetirizine HCl (ZYRTEC PO) Zyrtec    . ibuprofen (ADVIL,MOTRIN) 800 MG tablet ibuprofen 800 mg tablet  TAKE 1 TABLET (800 MG TOTAL) BY MOUTH EVERY 8 (EIGHT) HOURS AS NEEDED FOR PAIN.    . MELATONIN PO Take by mouth.    . Multiple Vitamins-Minerals (MULTIVITAMIN PO) Take by mouth.     No current facility-administered medications for this visit.    Allergies  Allergen Reactions  . Azithromycin Hives and Rash  . Erythromycin Rash  . Buspirone Hcl Other (See Comments)    'felt weird'       Objective:  There were no vitals taken for this visit.  Wt Readings from Last 3 Encounters:  10/26/18 152 lb (68.9 kg)  08/23/18 155 lb (70.3 kg)  04/09/18 150 lb (68 kg)   Temp Readings from Last 3 Encounters:  10/26/18 97.6 F (36.4 C) (Oral)  04/09/18 98 F  (36.7 C) (Oral)   BP Readings from Last 3 Encounters:  10/26/18 113/77  08/23/18 111/76  04/09/18 114/75   Pulse Readings from Last 3 Encounters:  10/26/18 66  08/23/18 83  04/09/18 71    Gen:  alert, not ill-appearing, no distress, appropriate for age HEENT: head normocephalic without obvious abnormality, conjunctiva and cornea clear, trachea midline Pulm: Normal work of breathing, normal phonation Neuro: alert and oriented x 3 Psych: cooperative, irritable mood, affect mood-congruent, speech is articulate, normal rate and volume; thought processes clear and goal-directed, normal judgment, good insight    No results found for this or any previous visit (from the past 72 hour(s)). No results found.    Assessment and Plan: 38 y.o. female with   .Diagnoses and all orders for this visit:  Anxiety with depression -     Discontinue: escitalopram (LEXAPRO) 5 MG tablet; Take 1 tablet (5 mg total) by mouth at bedtime for 5 days, THEN 2 tablets (10 mg total) at bedtime for 25 days.  Exercise-induced asthma -     albuterol (VENTOLIN HFA) 108 (90 Base) MCG/ACT inhaler; Inhale 1-2 puffs into the lungs every 4 (four) hours as needed for wheezing or shortness of breath.  Allergic rhinitis, unspecified seasonality, unspecified trigger -     levocetirizine (XYZAL ALLERGY 24HR) 5 MG tablet; Take 1 tablet (5 mg total) by mouth every evening.  Lexapro self-titration Counseled on MOA and potential AE of SSRI Scheduled for follow-up in 1 month for anxiety/depression   Follow Up Instructions:    I discussed the assessment and treatment plan with the patient. The patient was provided an opportunity to ask questions and all were answered. The patient agreed with the plan and demonstrated an understanding of the instructions.   The patient was advised to call back or seek an in-person evaluation if the symptoms worsen or if the condition fails to improve as anticipated.  I provided 15  minutes of non-face-to-face time during this encounter.   Carlis Stableharley Elizabeth Cummings, [place]

## 2019-04-12 ENCOUNTER — Ambulatory Visit: Payer: 59 | Admitting: Psychology

## 2019-04-26 ENCOUNTER — Ambulatory Visit: Payer: 59 | Admitting: Psychology

## 2019-04-27 ENCOUNTER — Ambulatory Visit (INDEPENDENT_AMBULATORY_CARE_PROVIDER_SITE_OTHER): Payer: 59 | Admitting: Family Medicine

## 2019-04-27 ENCOUNTER — Other Ambulatory Visit: Payer: Self-pay

## 2019-04-27 DIAGNOSIS — F418 Other specified anxiety disorders: Secondary | ICD-10-CM

## 2019-04-27 DIAGNOSIS — F951 Chronic motor or vocal tic disorder: Secondary | ICD-10-CM

## 2019-04-27 MED ORDER — CLONAZEPAM 0.5 MG PO TABS: 0.5000 mg | ORAL_TABLET | Freq: Two times a day (BID) | ORAL | 1 refills | Status: DC | PRN

## 2019-04-27 MED ORDER — FLUOXETINE HCL 10 MG PO CAPS: 10.0000 mg | ORAL_CAPSULE | Freq: Every day | ORAL | 1 refills | Status: DC

## 2019-04-27 NOTE — Progress Notes (Signed)
Virtual Visit  via Video Note  I connected with      Samantha Ochoa by a video enabled telemedicine application and verified that I am speaking with the correct person using two identifiers.   I discussed the limitations of evaluation and management by telemedicine and the availability of in person appointments. The patient expressed understanding and agreed to proceed.  History of Present Illness: Samantha Ochoa is a 38 y.o. female who would like to discuss follow-up anxiety and twitching.  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  She also has an independent anxiety disorder and her primary care provider recently started her on Lexapro to help manage her anxiety symptoms.  3 weeks ago she was started on Lexapro 5 and was increased to Lexapro 10 about a week and a half ago.  She notes last night she had an extremely bad episode of twitching making it difficult to walk.  She notes that sometimes the twitching responds to alcohol and she had a glass of wine last night which helps some.  She notes that her only new medications are Lexapro enzymes all starting about 3 weeks ago.  She has a follow-up appointment scheduled with Dr. Lyn Hollingshead next week.    Observations/Objective: Exam: Appearance Normal Speech.  Nontoxic.  Normal speech thought process and affect.  No SI or HI.    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  Cause of exacerbation is somewhat unclear at this time however the new prescription of Lexapro certainly could be a possibility here.  She notes is not provided much benefit for anxiety either.  I think it is reasonable to switch away from Lexapro to a different SSRI.  We will try using Prozac.  We will stop Lexapro tomorrow and start Prozac the same day.  Additionally will  prescribe Klonopin for use if she has another episode of twitching or taking.  This may also help anxiety temporarily.  Recheck with Dr. Lyn Hollingshead as scheduled next week.  PDMP not reviewed this encounter. No orders of the defined types were placed in this encounter.  Meds ordered this encounter  Medications  . FLUoxetine (PROZAC) 10 MG capsule    Sig: Take 1 capsule (10 mg total) by mouth daily.    Dispense:  30 capsule    Refill:  1    Replaces lexapro  . clonazePAM (KLONOPIN) 0.5 MG tablet    Sig: Take 1 tablet (0.5 mg total) by mouth 2 (two) times daily as needed (Anxiety or twitches/ticks).    Dispense:  20 tablet    Refill:  1    Follow Up Instructions:    I discussed the assessment and treatment plan with the patient. The patient was provided an opportunity to ask questions and all were answered. The patient agreed with the plan and demonstrated an understanding of the instructions.   The patient was advised to call back or seek an in-person evaluation if the symptoms worsen or if the condition fails to improve as anticipated.  Time: 25 minutes of intraservice time, with >39 minutes of total time during today's visit.      Historical information moved to improve visibility of documentation.  Past Medical History:  Diagnosis Date  . Anxiety   . Asthma    Past Surgical History:  Procedure Laterality  Date  . BLADDER SURGERY  1991   Social History   Tobacco Use  . Smoking status: Never Smoker  . Smokeless tobacco: Never Used  Substance Use Topics  . Alcohol use: Yes    Alcohol/week: 1.0 standard drinks    Types: 1 Standard drinks or equivalent per week   family history includes Breast cancer in her mother; Heart disease in her father; Hypertension in her mother; Seizures in her sister.  Medications: Current Outpatient Medications  Medication Sig Dispense Refill  . albuterol (VENTOLIN HFA) 108 (90 Base) MCG/ACT inhaler Inhale 1-2 puffs into the lungs every 4  (four) hours as needed for wheezing or shortness of breath. 18 g 3  . budesonide-formoterol (SYMBICORT) 80-4.5 MCG/ACT inhaler Inhale 2 puffs into the lungs 2 (two) times daily. 1 Inhaler 5  . clonazePAM (KLONOPIN) 0.5 MG tablet Take 1 tablet (0.5 mg total) by mouth 2 (two) times daily as needed (Anxiety or twitches/ticks). 20 tablet 1  . FLUoxetine (PROZAC) 10 MG capsule Take 1 capsule (10 mg total) by mouth daily. 30 capsule 1  . ibuprofen (ADVIL,MOTRIN) 800 MG tablet ibuprofen 800 mg tablet  TAKE 1 TABLET (800 MG TOTAL) BY MOUTH EVERY 8 (EIGHT) HOURS AS NEEDED FOR PAIN.    Marland Kitchen levocetirizine (XYZAL ALLERGY 24HR) 5 MG tablet Take 1 tablet (5 mg total) by mouth every evening. 90 tablet 3  . MELATONIN PO Take by mouth.    . Multiple Vitamins-Minerals (MULTIVITAMIN PO) Take by mouth.     No current facility-administered medications for this visit.    Allergies  Allergen Reactions  . Azithromycin Hives and Rash  . Erythromycin Rash  . Buspirone Hcl Other (See Comments)    'felt weird'

## 2019-05-03 ENCOUNTER — Ambulatory Visit: Payer: 59 | Admitting: Osteopathic Medicine

## 2019-06-09 ENCOUNTER — Encounter: Payer: Self-pay | Admitting: Osteopathic Medicine

## 2019-06-09 ENCOUNTER — Ambulatory Visit (INDEPENDENT_AMBULATORY_CARE_PROVIDER_SITE_OTHER): Payer: 59 | Admitting: Osteopathic Medicine

## 2019-06-09 VITALS — Wt 158.0 lb

## 2019-06-09 DIAGNOSIS — F418 Other specified anxiety disorders: Secondary | ICD-10-CM | POA: Diagnosis not present

## 2019-06-09 DIAGNOSIS — J4520 Mild intermittent asthma, uncomplicated: Secondary | ICD-10-CM

## 2019-06-09 DIAGNOSIS — IMO0001 Reserved for inherently not codable concepts without codable children: Secondary | ICD-10-CM

## 2019-06-09 MED ORDER — PREDNISONE 20 MG PO TABS: 20.0000 mg | ORAL_TABLET | Freq: Two times a day (BID) | ORAL | 2 refills | Status: DC

## 2019-06-09 MED ORDER — FLUOXETINE HCL 20 MG PO CAPS: 20.0000 mg | ORAL_CAPSULE | Freq: Every day | ORAL | 1 refills | Status: DC

## 2019-06-09 MED ORDER — MONTELUKAST SODIUM 10 MG PO TABS: 10.0000 mg | ORAL_TABLET | Freq: Every day | ORAL | 3 refills | Status: DC

## 2019-06-09 MED ORDER — E-Z SPACER DEVI: 2 refills | Status: DC

## 2019-06-09 MED ORDER — FLUTICASONE FUROATE-VILANTEROL 200-25 MCG/INH IN AEPB: 1.0000 | INHALATION_SPRAY | Freq: Every day | RESPIRATORY_TRACT | 4 refills | Status: DC

## 2019-06-09 NOTE — Patient Instructions (Addendum)
Plan:  Asthma:   I would like to switch your Symbicort inhaler to something called a Brio Ellipta device.  See link to YouTube video below for instructions on how to use this.  This is a once a day medication which will hopefully help get the asthma under better control long-term.    [email or web address]  In addition, I have sent a spacer device prescription into the pharmacy for you to use with your albuterol rescue inhaler as needed  I've also sent a pill called montelukast/Singulair to take once daily which will also hopefully help allergies/asthma.  Last but not least, I sent in a prescription for steroids to use for significant asthma exacerbation unresponsive to your usual medications.  It is common for asthma patients to have exacerbations with change of seasons, viral infections, severe allergies, or other triggers.  If you feel you need the steroids, please start taking them!   Anxiety:   Let's try increasing the dose of the Loxitane/Prozac from 10 mg to 20 mg and see how this does for you over the next couple of weeks.  Full effect of any medication dosage change will probably take about 4 weeks or so, that you should start to notice some improvement sooner than that  Return in about 4 weeks (around 07/07/2019) for virtual visit - recheck asthma and anxiety. See me sooner if needed! .  The front office will give you a call to schedule this appointment.

## 2019-06-09 NOTE — Progress Notes (Signed)
Virtual Visit via Video (App used: Docimity) Note  I connected with      Samantha Ochoa on 06/09/19 at 8:44 AM  by a telemedicine application and verified that I am speaking with the correct person using two identifiers.  Patient is at home I am in office   I discussed the limitations of evaluation and management by telemedicine and the availability of in person appointments. The patient expressed understanding and agreed to proceed.  History of Present Illness: Samantha Ochoa is a 38 y.o. female who would like to discuss asthma and anxiety   Asthma: Has been noting some significant increase shortness of breath, particularly over the past month or so.  She is waking up at night short of breath, she is finding that she is using her inhaler multiple times per day, she is sometimes using the Symbicort as a rescue in place of the albuterol.  She is inconsistent about twice daily use of Symbicort for maintenance.  Anxiety:  Increased anxiety/irritability.  She feels guilty that she is kind of taking this out on her children.  She recently went on a weekend away with some friends and felt great until she came back to "normal life".  The pros cause issues with side effects.  Fluoxetine at 10 mg doesn't seem to be making too much difference.  Patient had previously been seeing counselor, is thinking about maybe getting in touch with this person, they left follow-up pretty open ended Previously seeing counselor - could try  Lexapro       Observations/Objective: Wt 158 lb (71.7 kg)   BMI 26.29 kg/m  BP Readings from Last 3 Encounters:  10/26/18 113/77  08/23/18 111/76  04/09/18 114/75   Exam: Normal Speech.  NAD  Lab and Radiology Results No results found for this or any previous visit (from the past 72 hour(s)). No results found.     Assessment and Plan: 38 y.o. female with The primary encounter diagnosis was Moderate intermittent asthma without complication. A  diagnosis of Anxiety with depression was also pertinent to this visit.   PDMP not reviewed this encounter. No orders of the defined types were placed in this encounter.  Meds ordered this encounter  Medications  . FLUoxetine (PROZAC) 20 MG capsule    Sig: Take 1 capsule (20 mg total) by mouth daily.    Dispense:  90 capsule    Refill:  1    Replaces lexapro  . fluticasone furoate-vilanterol (BREO ELLIPTA) 200-25 MCG/INH AEPB    Sig: Inhale 1 puff into the lungs daily.    Dispense:  3 each    Refill:  4  . montelukast (SINGULAIR) 10 MG tablet    Sig: Take 1 tablet (10 mg total) by mouth at bedtime.    Dispense:  90 tablet    Refill:  3  . predniSONE (DELTASONE) 20 MG tablet    Sig: Take 1 tablet (20 mg total) by mouth 2 (two) times daily with a meal. As needed for asthma exacerbation    Dispense:  10 tablet    Refill:  2   Patient Instructions  Plan:  Asthma:   I would like to switch your Symbicort inhaler to something called a Brio Ellipta device.  See link to YouTube video below for instructions on how to use this.  This is a once a day medication which will hopefully help get the asthma under better control long-term.    [email or web address]  In addition, I have sent a  spacer device prescription into the pharmacy for you to use with your albuterol rescue inhaler as needed  I've also sent a pill called montelukast/Singulair to take once daily which will also hopefully help allergies/asthma.  Last but not least, I sent in a prescription for steroids to use for significant asthma exacerbation unresponsive to your usual medications.  It is common for asthma patients to have exacerbations with change of seasons, viral infections, severe allergies, or other triggers.  If you feel you need the steroids, please start taking them!   Anxiety:   Let's try increasing the dose of the Loxitane/Prozac from 10 mg to 20 mg and see how this does for you over the next  couple of weeks.  Full effect of any medication dosage change will probably take about 4 weeks or so, that you should start to notice some improvement sooner than that  Return in about 4 weeks (around 07/07/2019) for virtual visit - recheck asthma and anxiety. See me sooner if needed! .  The front office will give you a call to schedule this appointment.       Instructions sent via MyChart. If MyChart not available, pt was given option for info via personal e-mail w/ no guarantee of protected health info over unsecured e-mail communication, and MyChart sign-up instructions were sent to patient.   Follow Up Instructions: Return in about 4 weeks (around 07/07/2019) for virtual visit - recheck asthma and anxiety. See me sooner if needed! .    I discussed the assessment and treatment plan with the patient. The patient was provided an opportunity to ask questions and all were answered. The patient agreed with the plan and demonstrated an understanding of the instructions.   The patient was advised to call back or seek an in-person evaluation if any new concerns, if symptoms worsen or if the condition fails to improve as anticipated.  25 minutes of non-face-to-face time was provided during this encounter.      . . . . . . . . . . . . . Marland Kitchen.                   Historical information moved to improve visibility of documentation.  Past Medical History:  Diagnosis Date  . Anxiety   . Asthma    Past Surgical History:  Procedure Laterality Date  . BLADDER SURGERY  1991   Social History   Tobacco Use  . Smoking status: Never Smoker  . Smokeless tobacco: Never Used  Substance Use Topics  . Alcohol use: Yes    Alcohol/week: 1.0 standard drinks    Types: 1 Standard drinks or equivalent per week   family history includes Breast cancer in her mother; Heart disease in her father; Hypertension in her mother; Seizures in her sister.  Medications: Current  Outpatient Medications  Medication Sig Dispense Refill  . albuterol (VENTOLIN HFA) 108 (90 Base) MCG/ACT inhaler Inhale 1-2 puffs into the lungs every 4 (four) hours as needed for wheezing or shortness of breath. 18 g 3  . budesonide-formoterol (SYMBICORT) 80-4.5 MCG/ACT inhaler Inhale 2 puffs into the lungs 2 (two) times daily. 1 Inhaler 5  . clonazePAM (KLONOPIN) 0.5 MG tablet Take 1 tablet (0.5 mg total) by mouth 2 (two) times daily as needed (Anxiety or twitches/ticks). 20 tablet 1  . FLUoxetine (PROZAC) 20 MG capsule Take 1 capsule (20 mg total) by mouth daily. 90 capsule 1  . ibuprofen (ADVIL,MOTRIN) 800 MG tablet ibuprofen 800 mg tablet  TAKE  1 TABLET (800 MG TOTAL) BY MOUTH EVERY 8 (EIGHT) HOURS AS NEEDED FOR PAIN.    Marland Kitchen levocetirizine (XYZAL ALLERGY 24HR) 5 MG tablet Take 1 tablet (5 mg total) by mouth every evening. 90 tablet 3  . MELATONIN PO Take by mouth.    . Multiple Vitamins-Minerals (MULTIVITAMIN PO) Take by mouth.    . fluticasone furoate-vilanterol (BREO ELLIPTA) 200-25 MCG/INH AEPB Inhale 1 puff into the lungs daily. 3 each 4  . montelukast (SINGULAIR) 10 MG tablet Take 1 tablet (10 mg total) by mouth at bedtime. 90 tablet 3  . predniSONE (DELTASONE) 20 MG tablet Take 1 tablet (20 mg total) by mouth 2 (two) times daily with a meal. As needed for asthma exacerbation 10 tablet 2   No current facility-administered medications for this visit.    Allergies  Allergen Reactions  . Azithromycin Hives and Rash  . Erythromycin Rash  . Buspirone Hcl Other (See Comments)    'felt weird'

## 2019-07-07 ENCOUNTER — Ambulatory Visit (INDEPENDENT_AMBULATORY_CARE_PROVIDER_SITE_OTHER): Payer: 59 | Admitting: Osteopathic Medicine

## 2019-07-07 ENCOUNTER — Encounter: Payer: Self-pay | Admitting: Osteopathic Medicine

## 2019-07-07 ENCOUNTER — Other Ambulatory Visit: Payer: Self-pay

## 2019-07-07 VITALS — Wt 160.0 lb

## 2019-07-07 DIAGNOSIS — J4540 Moderate persistent asthma, uncomplicated: Secondary | ICD-10-CM | POA: Diagnosis not present

## 2019-07-07 DIAGNOSIS — Z79899 Other long term (current) drug therapy: Secondary | ICD-10-CM

## 2019-07-07 DIAGNOSIS — F418 Other specified anxiety disorders: Secondary | ICD-10-CM

## 2019-07-07 DIAGNOSIS — Z7951 Long term (current) use of inhaled steroids: Secondary | ICD-10-CM | POA: Diagnosis not present

## 2019-07-07 MED ORDER — FLUOXETINE HCL 10 MG PO CAPS: 10.0000 mg | ORAL_CAPSULE | Freq: Every day | ORAL | 1 refills | Status: DC

## 2019-07-07 MED ORDER — FLUTICASONE FUROATE-VILANTEROL 100-25 MCG/INH IN AEPB: 1.0000 | INHALATION_SPRAY | Freq: Every day | RESPIRATORY_TRACT | 5 refills | Status: DC

## 2019-07-07 MED ORDER — FLUOXETINE HCL 20 MG PO CAPS: 20.0000 mg | ORAL_CAPSULE | Freq: Every day | ORAL | 1 refills | Status: DC

## 2019-07-07 NOTE — Patient Instructions (Signed)
Asthma: Let us try reducing the dose of the Breo just a bit in terms of the steroid component.  This might help hoarseness with a lower dose of the medication, please let me know if it does not seem to make any difference/if hoarseness persists.  Anxiety: Let us try adding 10 mg Prozac to the 20 mg that you are on now, for total daily dose 30 mg.  Let me know how this is working over the next month or so.  I have sent a reminder in my chart to alert you around then to message me back with an update, but please let me know sooner than that if there is anything else I can do for you!

## 2019-07-07 NOTE — Progress Notes (Signed)
Virtual Visit via Video (App used: MyChart) Note  I connected with      Samantha LenisJennifer Hirata on 07/07/19 at 11:13 AM  by a telemedicine application and verified that I am speaking with the correct person using two identifiers.  Patient is at home I am in office   I discussed the limitations of evaluation and management by telemedicine and the availability of in person appointments. The patient expressed understanding and agreed to proceed.  History of Present Illness: Samantha Ochoa is a 38 y.o. female who would like to discuss anxiety and asthma   Last visit about a month ago, patient was having some issues with increased shortness of breath, multiple times per day use of rescue inhaler, and inconsistent use of Symbicort.  [facility] for ease of once daily use, added Singulair, added prednisone burst to take as needed.   Today: Patient reports that breathing is a lot better, she has not had to take her rescue inhaler so often, was even able to go for a run without using any inhaler at all!  The adverse effects she has noticed is hoarseness  Last visit about a month ago, patient was noting some increased anxiety/irritability.  Fluoxetine at 10 mg did not seem to be making much difference.  Lexapro had caused problems in the past.  We decided to increase fluoxetine/Prozac up to 20 mg daily.   Today: She reports that this has helped a bit, she is still feeling particularly stressed especially since kids are home due to Covid exposure/quarantine   GAD 7 : Generalized Anxiety Score 07/07/2019 06/09/2019 04/07/2019 10/28/2018  Nervous, Anxious, on Edge 1 3 2 1   Control/stop worrying 0 1 1 1   Worry too much - different things 1 1 1 1   Trouble relaxing 0 1 2 0  Restless 0 0 1 0  Easily annoyed or irritable 1 3 3 1   Afraid - awful might happen 0 0 0 0  Total GAD 7 Score 3 9 10 4   Anxiety Difficulty Somewhat difficult Somewhat difficult - -    Depression  screen [facility] 2/9 07/07/2019 06/09/2019 04/07/2019  Decreased Interest 1 0 0  Down, Depressed, Hopeless 1 1 1   PHQ - 2 Score 2 1 1   Altered sleeping 1 1 0  Tired, decreased energy 0 0 3  Change in appetite 2 3 0  Feeling bad or failure about yourself  0 1 1  Trouble concentrating 0 0 1  Moving slowly or fidgety/restless 0 0 0  Suicidal thoughts 0 0 0  PHQ-9 Score 5 6 6   Difficult doing work/chores Somewhat difficult Somewhat difficult -       Observations/Objective: Wt 160 lb (72.6 kg)   BMI 26.63 kg/m  BP Readings from Last 3 Encounters:  10/26/18 113/77  08/23/18 111/76  04/09/18 114/75   Exam: Normal Speech.  NAD  Lab and Radiology Results No results found for this or any previous visit (from the past 72 hour(s)). No results found.     Assessment and Plan: 38 y.o. female with The primary encounter diagnosis was Anxiety with depression. A diagnosis of Moderate persistent asthma without complication was also pertinent to this visit.   PDMP not reviewed this encounter. No orders of the defined types were placed in this encounter.  Meds ordered this encounter  Medications  . DISCONTD: FLUoxetine (PROZAC) 10 MG capsule    Sig: Take 1 capsule (10 mg total) by mouth daily.  Dispense:  90 capsule    Refill:  1  . fluticasone furoate-vilanterol (BREO ELLIPTA) 100-25 MCG/INH AEPB    Sig: Inhale 1 puff into the lungs daily.    Dispense:  120 each    Refill:  5  . FLUoxetine (PROZAC) 10 MG capsule    Sig: Take 1 capsule (10 mg total) by mouth daily. Take with 20 mg dose for total daily dose 30 mg    Dispense:  90 capsule    Refill:  1  . FLUoxetine (PROZAC) 20 MG capsule    Sig: Take 1 capsule (20 mg total) by mouth daily. Take with 10 mg dose for total daily dose 30 mg    Dispense:  90 capsule    Refill:  1   Patient Instructions  Asthma: Let us try reducing the dose of the Breo just a bit in terms of the steroid component.  This might help hoarseness with a  lower dose of the medication, please let me know if it does not seem to make any difference/if hoarseness persists.  Anxiety: Let us try adding 10 mg Prozac to the 20 mg that you are on now, for total daily dose 30 mg.  Let me know how this is working over the next month or so.  I have sent a reminder in my chart to alert you around then to message me back with an update, but please let me know sooner than that if there is anything else I can do for you!     Instructions sent via MyChart. If MyChart not available, pt was given option for info via personal e-mail w/ no guarantee of protected health info over unsecured e-mail communication, and MyChart sign-up instructions were sent to patient.   Follow Up Instructions: Return if symptoms worsen or fail to improve.    I discussed the assessment and treatment plan with the patient. The patient was provided an opportunity to ask questions and all were answered. The patient agreed with the plan and demonstrated an understanding of the instructions.   The patient was advised to call back or seek an in-person evaluation if any new concerns, if symptoms worsen or if the condition fails to improve as anticipated.  25 minutes of non-face-to-face time was provided during this encounter.      . . . . . . . . . . . . . Marland Kitchen                   Historical information moved to improve visibility of documentation.  Past Medical History:  Diagnosis Date  . Anxiety   . Asthma    Past Surgical History:  Procedure Laterality Date  . BLADDER SURGERY  1991   Social History   Tobacco Use  . Smoking status: Never Smoker  . Smokeless tobacco: Never Used  Substance Use Topics  . Alcohol use: Yes    Alcohol/week: 1.0 standard drinks    Types: 1 Standard drinks or equivalent per week   family history includes Breast cancer in her mother; Heart disease in her father; Hypertension in her mother; Seizures in her  sister.  Medications: Current Outpatient Medications  Medication Sig Dispense Refill  . albuterol (VENTOLIN HFA) 108 (90 Base) MCG/ACT inhaler Inhale 1-2 puffs into the lungs every 4 (four) hours as needed for wheezing or shortness of breath. 18 g 3  . budesonide-formoterol (SYMBICORT) 80-4.5 MCG/ACT inhaler Inhale 2 puffs into the lungs 2 (two) times daily. 1 Inhaler 5  .  clonazePAM (KLONOPIN) 0.5 MG tablet Take 1 tablet (0.5 mg total) by mouth 2 (two) times daily as needed (Anxiety or twitches/ticks). 20 tablet 1  . FLUoxetine (PROZAC) 20 MG capsule Take 1 capsule (20 mg total) by mouth daily. 90 capsule 1  . fluticasone furoate-vilanterol (BREO ELLIPTA) 200-25 MCG/INH AEPB Inhale 1 puff into the lungs daily. 3 each 4  . levocetirizine (XYZAL ALLERGY 24HR) 5 MG tablet Take 1 tablet (5 mg total) by mouth every evening. 90 tablet 3  . MELATONIN PO Take by mouth.    . montelukast (SINGULAIR) 10 MG tablet Take 1 tablet (10 mg total) by mouth at bedtime. 90 tablet 3  . Multiple Vitamins-Minerals (MULTIVITAMIN PO) Take by mouth.    . predniSONE (DELTASONE) 20 MG tablet Take 1 tablet (20 mg total) by mouth 2 (two) times daily with a meal. As needed for asthma exacerbation 10 tablet 2  . Spacer/Aero-Holding Chambers (E-Z SPACER) inhaler Use as instructed 1 each 2  . ibuprofen (ADVIL,MOTRIN) 800 MG tablet ibuprofen 800 mg tablet  TAKE 1 TABLET (800 MG TOTAL) BY MOUTH EVERY 8 (EIGHT) HOURS AS NEEDED FOR PAIN.     No current facility-administered medications for this visit.   Allergies  Allergen Reactions  . Azithromycin Hives and Rash  . Erythromycin Rash  . Buspirone Hcl Other (See Comments)    'felt weird'

## 2019-09-14 ENCOUNTER — Encounter: Payer: Self-pay | Admitting: Osteopathic Medicine

## 2019-09-16 ENCOUNTER — Other Ambulatory Visit: Payer: Self-pay | Admitting: Osteopathic Medicine

## 2019-09-16 MED ORDER — FLUOXETINE HCL 10 MG PO CAPS: 10.0000 mg | ORAL_CAPSULE | Freq: Every day | ORAL | 1 refills | Status: DC

## 2019-09-16 MED ORDER — FLUOXETINE HCL 20 MG PO CAPS: 20.0000 mg | ORAL_CAPSULE | Freq: Every day | ORAL | 1 refills | Status: DC

## 2019-09-29 ENCOUNTER — Ambulatory Visit: Payer: 59

## 2019-12-04 ENCOUNTER — Other Ambulatory Visit: Payer: Self-pay | Admitting: Osteopathic Medicine

## 2020-03-31 ENCOUNTER — Encounter: Payer: Self-pay | Admitting: Physician Assistant

## 2020-04-04 ENCOUNTER — Ambulatory Visit (INDEPENDENT_AMBULATORY_CARE_PROVIDER_SITE_OTHER): Payer: 59 | Admitting: Osteopathic Medicine

## 2020-04-04 DIAGNOSIS — F418 Other specified anxiety disorders: Secondary | ICD-10-CM

## 2020-04-05 MED ORDER — FLUOXETINE HCL 20 MG PO CAPS: 20.0000 mg | ORAL_CAPSULE | Freq: Every day | ORAL | 3 refills | Status: DC

## 2020-04-05 MED ORDER — FLUOXETINE HCL 10 MG PO CAPS: 10.0000 mg | ORAL_CAPSULE | Freq: Every day | ORAL | 3 refills | Status: DC

## 2020-05-03 NOTE — Telephone Encounter (Signed)
This encounter was created in error - please disregard.

## 2020-07-06 ENCOUNTER — Telehealth: Payer: Self-pay

## 2020-07-06 NOTE — Telephone Encounter (Signed)
Pt called requesting a Nutritionist referral. Per pt, her eating habits has gotten out of control. Pt states being an emotional eater but it seems as though she eating all the time, even when she isn't hungry. Provider was notified of pt's call. Provider has requested for pt to schedule an appointment for an evaluation. Attempted to contact the pt regarding provider's recommendation. No answer, left a detailed vm msg for pt to return a call back to the clinic for scheduling. Direct call back info provided.

## 2020-07-09 ENCOUNTER — Ambulatory Visit: Payer: 59 | Admitting: Osteopathic Medicine

## 2020-07-31 ENCOUNTER — Encounter: Payer: Self-pay | Admitting: Osteopathic Medicine

## 2020-07-31 ENCOUNTER — Telehealth (INDEPENDENT_AMBULATORY_CARE_PROVIDER_SITE_OTHER): Payer: 59 | Admitting: Osteopathic Medicine

## 2020-07-31 VITALS — HR 81 | Wt 168.0 lb

## 2020-07-31 DIAGNOSIS — Z8659 Personal history of other mental and behavioral disorders: Secondary | ICD-10-CM

## 2020-07-31 DIAGNOSIS — J45990 Exercise induced bronchospasm: Secondary | ICD-10-CM

## 2020-07-31 DIAGNOSIS — F418 Other specified anxiety disorders: Secondary | ICD-10-CM | POA: Diagnosis not present

## 2020-07-31 MED ORDER — ALBUTEROL SULFATE HFA 108 (90 BASE) MCG/ACT IN AERS: 1.0000 | INHALATION_SPRAY | RESPIRATORY_TRACT | 11 refills | Status: DC | PRN

## 2020-07-31 NOTE — Progress Notes (Signed)
Telemedicine Visit via  Video & Audio (App used: MyChart)   I connected with Samantha Ochoa on 07/31/20 at 10:42 AM  by phone or  telemedicine application as noted above  I verified that I am speaking with or regarding  the correct patient using two identifiers.  Participants: Myself, Dr Sunnie Nielsen DO Patient: Samantha Ochoa   Patient is at home I am in office at [facility]    I discussed the limitations of evaluation and management  by telemedicine and the availability of in person appointments.  The participant(s) above expressed understanding and  agreed to proceed with this appointment via telemedicine.       History of Present Illness: Samantha Ochoa is a 40 y.o. female who would like to discuss follow up mental health  Overall doing well! Has come off Rx and has been working on diet/exercise to good effect. Concerned, though, given history of bulimia that she will start back into unhealthy patterns, would like to get connected w/ counselor / dietician.        Depression screen [facility] 2/9 07/31/2020 07/07/2019 06/09/2019  Decreased Interest 0 1 0  Down, Depressed, Hopeless 0 1 1  PHQ - 2 Score 0 2 1  Altered sleeping 1 1 1   Tired, decreased energy 0 0 0  Change in appetite 1 2 3   Feeling bad or failure about yourself  0 0 1  Trouble concentrating 1 0 0  Moving slowly or fidgety/restless 0 0 0  Suicidal thoughts 0 0 0  PHQ-9 Score 3 5 6   Difficult doing work/chores Somewhat difficult Somewhat difficult Somewhat difficult   GAD 7 : Generalized Anxiety Score 07/31/2020 07/07/2019 06/09/2019 04/07/2019  Nervous, Anxious, on Edge 0 1 3 2   Control/stop worrying 0 0 1 1  Worry too much - different things 0 1 1 1   Trouble relaxing 0 0 1 2  Restless 0 0 0 1  Easily annoyed or irritable 1 1 3 3   Afraid - awful might happen 0 0 0 0  Total GAD 7 Score 1 3 9 10   Anxiety Difficulty Somewhat difficult Somewhat difficult Somewhat difficult -        Observations/Objective: Pulse 81   Wt 168 lb (76.2 kg)   BMI 27.96 kg/m  BP Readings from Last 3 Encounters:  10/26/18 113/77  08/23/18 111/76  04/09/18 114/75   Exam: Normal Speech.  NAD  Lab and Radiology Results No results found for this or any previous visit (from the past 72 hour(s)). No results found.     Assessment and Plan: 40 y.o. female with The primary encounter diagnosis was Anxiety with depression - stable/controlled without Rx, patient is diligently exercising and has adjusted diet . Diagnoses of Exercise-induced asthma and History of bulimia nervosa were also pertinent to this visit.   PDMP not reviewed this encounter. No orders of the defined types were placed in this encounter.  Meds ordered this encounter  Medications  . albuterol (VENTOLIN HFA) 108 (90 Base) MCG/ACT inhaler    Sig: Inhale 1-2 puffs into the lungs every 4 (four) hours as needed for wheezing or shortness of breath.    Dispense:  18 g    Refill:  11   Patient Instructions  Plan:  I was able to get some resources for mental health help that might be especially beneficial given your history.  Let me know which of these locations/clinics might be convenient for you!    [facility]  Instructions sent via MyChart.   Follow Up Instructions: Return in about 3 months (around 10/29/2020) for [facility].    I discussed the assessment and treatment plan with the patient. The patient was provided an opportunity to ask questions and all were answered. The patient agreed with the plan and demonstrated an understanding of the instructions.   The patient was advised to call back or seek an in-person evaluation if any new concerns, if symptoms worsen or if the condition fails to improve as anticipated.  30 minutes of non-face-to-face time was provided during this  encounter.      . . . . . . . . . . . . . Marland Kitchen                   Historical information moved to improve visibility of documentation.  Past Medical History:  Diagnosis Date  . Anxiety   . Asthma    Past Surgical History:  Procedure Laterality Date  . BLADDER SURGERY  1991   Social History   Tobacco Use  . Smoking status: Never Smoker  . Smokeless tobacco: Never Used  Substance Use Topics  . Alcohol use: Yes    Alcohol/week: 1.0 standard drink    Types: 1 Standard drinks or equivalent per week   family history includes Breast cancer in her mother; Heart disease in her father; Hypertension in her mother; Seizures in her sister.  Medications: Current Outpatient Medications  Medication Sig Dispense Refill  . JUNEL FE 1/20 1-20 MG-MCG tablet Take 1 tablet by mouth daily.    . paragard intrauterine copper IUD IUD 1 each by Intrauterine route once.    Marland Kitchen albuterol (VENTOLIN HFA) 108 (90 Base) MCG/ACT inhaler Inhale 1-2 puffs into the lungs every 4 (four) hours as needed for wheezing or shortness of breath. 18 g 11   No current facility-administered medications for this visit.   Allergies  Allergen Reactions  . Azithromycin Hives and Rash  . Erythromycin Rash  . Buspirone Hcl Other (See Comments)    'felt weird'     If phone visit, billing and coding can please add appropriate modifier if needed

## 2020-08-01 ENCOUNTER — Encounter: Payer: Self-pay | Admitting: Osteopathic Medicine

## 2020-08-01 DIAGNOSIS — F418 Other specified anxiety disorders: Secondary | ICD-10-CM

## 2020-08-01 NOTE — Patient Instructions (Addendum)
Plan:  I was able to get some resources for mental health help that might be especially beneficial given your history.  Let me know which of these locations/clinics might be convenient for you!    [facility] [employer]

## 2020-08-10 ENCOUNTER — Telehealth (INDEPENDENT_AMBULATORY_CARE_PROVIDER_SITE_OTHER): Payer: 59 | Admitting: Osteopathic Medicine

## 2020-08-10 ENCOUNTER — Encounter: Payer: Self-pay | Admitting: Osteopathic Medicine

## 2020-08-10 VITALS — HR 80 | Wt 174.0 lb

## 2020-08-10 DIAGNOSIS — J019 Acute sinusitis, unspecified: Secondary | ICD-10-CM

## 2020-08-10 MED ORDER — AMOXICILLIN-POT CLAVULANATE 875-125 MG PO TABS: 1.0000 | ORAL_TABLET | Freq: Two times a day (BID) | ORAL | 0 refills | Status: DC

## 2020-08-10 NOTE — Patient Instructions (Signed)
Medications & Home Remedies for Upper Respiratory Illness   Note: the following list assumes no pregnancy, normal liver & kidney function and no other drug interactions. Dr. Lyn Hollingshead has highlighted medications which are safe for you to use, but these may not be appropriate for everyone. Always ask a pharmacist or qualified medical provider if you have any questions!    Aches/Pains, Fever, Headache OTC Acetaminophen (Tylenol) 500 mg tablets - take max 2 tablets (1000 mg) every 6 hours (4 times per day)  OTC Ibuprofen (Motrin) 200 mg tablets - take max 4 tablets (800 mg) every 6 hours*   Sinus Congestion Prescription Atrovent as directed OTC Nasal Saline if desired to rinse OTC Oxymetolazone (Afrin, others) sparing use due to rebound congestion, NEVER use in kids OTC Phenylephrine (Sudafed) 10 mg tablets every 4 hours (or the 12-hour formulation)* OTC Diphenhydramine (Benadryl) 25 mg tablets - take max 2 tablets every 4 hours   Cough & Sore Throat OTC Dextromethorphan (Robitussin, others) - cough suppressant OTC Guaifenesin (Robitussin, Mucinex, others) - expectorant (helps cough up mucus) (Dextromethorphan and Guaifenesin also come in a combination tablet/syrup) OTC Lozenges w/ Benzocaine + Menthol (Cepacol) Honey - as much as you want! Teas which "coat the throat" - look for ingredients Elm Bark, Licorice Root, Marshmallow Root   Other Prescription Antibiotics if these are necessary for bacterial infection - take ALL, even if you're feeling better  OTC Zinc Lozenges within 24 hours of symptoms onset - mixed evidence this shortens the duration of the common cold Don't waste your money on Vitamin C or Echinacea in acute illness - it's already too late!    *Caution in patients with high blood pressure

## 2020-08-10 NOTE — Progress Notes (Signed)
Telemedicine Visit via  Video & Audio (App used: MyChart)   I connected with Samantha Ochoa on 08/10/20 at 10:15 AM  by phone or  telemedicine application as noted above  I verified that I am speaking with or regarding  the correct patient using two identifiers.  Participants: Myself, Dr Sunnie Nielsen DO Patient: Samantha Ochoa Patient proxy if applicable: none Other, if applicable: none  Patient is at home I am in office at [facility]    I discussed the limitations of evaluation and management  by telemedicine and the availability of in person appointments.  The participant(s) above expressed understanding and  agreed to proceed with this appointment via telemedicine.       History of Present Illness: Samantha Ochoa is a 40 y.o. female who would like to discuss sinus pain.  Presumed COVID infection a few weeks ago, symptoms from that have mostly resolved (patient is vaccinated and boosted, not vaccinated for flu) except for right sided facial/dental pain particularly worse when she lies down on that side.  She has tried over-the-counter treatments with Benadryl, Sudafed, Tylenol, ibuprofen which have not yielded much relief.  She has also done saline nasal washes.  No fever, no headache/vision change, no difficulty swallowing.         Observations/Objective: Pulse 80    Wt 174 lb (78.9 kg)    BMI 28.96 kg/m  BP Readings from Last 3 Encounters:  10/26/18 113/77  08/23/18 111/76  04/09/18 114/75   Exam: Normal Speech.  NAD  Lab and Radiology Results No results found for this or any previous visit (from the past 72 hour(s)). No results found.     Assessment and Plan: 40 y.o. female with The encounter diagnosis was Acute non-recurrent sinusitis, unspecified location.  Secondary sickening symptoms consistent with bacterial sinusitis.  Allergy list reviewed, antibiotics as below sent.  Okay to take ibuprofen 800 mg every 6-8 hours, okay to  take Benadryl 50 mg nightly to help with sleep.  Patient advised if no better in 5 days, or sooner if worse, let us know and may need to escalate antibiotics to doxycycline or similar and consider CT sinus  PDMP not reviewed this encounter. No orders of the defined types were placed in this encounter.  Meds ordered this encounter  Medications   amoxicillin-clavulanate (AUGMENTIN) 875-125 MG tablet    Sig: Take 1 tablet by mouth 2 (two) times daily.    Dispense:  14 tablet    Refill:  0   Patient Instructions  Medications & Home Remedies for Upper Respiratory Illness   Note: the following list assumes no pregnancy, normal liver & kidney function and no other drug interactions. Dr. Lyn Hollingshead has highlighted medications which are safe for you to use, but these may not be appropriate for everyone. Always ask a pharmacist or qualified medical provider if you have any questions!    Aches/Pains, Fever, Headache OTC Acetaminophen (Tylenol) 500 mg tablets - take max 2 tablets (1000 mg) every 6 hours (4 times per day)  OTC Ibuprofen (Motrin) 200 mg tablets - take max 4 tablets (800 mg) every 6 hours*   Sinus Congestion Prescription Atrovent as directed OTC Nasal Saline if desired to rinse OTC Oxymetolazone (Afrin, others) sparing use due to rebound congestion, NEVER use in kids OTC Phenylephrine (Sudafed) 10 mg tablets every 4 hours (or the 12-hour formulation)* OTC Diphenhydramine (Benadryl) 25 mg tablets - take max 2 tablets every 4 hours   Cough & Sore Throat OTC Dextromethorphan (  Robitussin, others) - cough suppressant OTC Guaifenesin (Robitussin, Mucinex, others) - expectorant (helps cough up mucus) (Dextromethorphan and Guaifenesin also come in a combination tablet/syrup) OTC Lozenges w/ Benzocaine + Menthol (Cepacol) Honey - as much as you want! Teas which "coat the throat" - look for ingredients Elm Bark, Licorice Root, Marshmallow Root   Other Prescription Antibiotics if these  are necessary for bacterial infection - take ALL, even if you're feeling better  OTC Zinc Lozenges within 24 hours of symptoms onset - mixed evidence this shortens the duration of the common cold Don't waste your money on Vitamin C or Echinacea in acute illness - it's already too late!    *Caution in patients with high blood pressure      Instructions sent via MyChart.   Follow Up Instructions: Return if symptoms worsen or fail to improve.    I discussed the assessment and treatment plan with the patient. The patient was provided an opportunity to ask questions and all were answered. The patient agreed with the plan and demonstrated an understanding of the instructions.   The patient was advised to call back or seek an in-person evaluation if any new concerns, if symptoms worsen or if the condition fails to improve as anticipated.  20 minutes of non-face-to-face time was provided during this encounter.      . . . . . . . . . . . . . Marland Kitchen                   Historical information moved to improve visibility of documentation.  Past Medical History:  Diagnosis Date   Anxiety    Asthma    Past Surgical History:  Procedure Laterality Date   BLADDER SURGERY  1991   Social History   Tobacco Use   Smoking status: Never Smoker   Smokeless tobacco: Never Used  Substance Use Topics   Alcohol use: Yes    Alcohol/week: 1.0 standard drink    Types: 1 Standard drinks or equivalent per week   family history includes Breast cancer in her mother; Heart disease in her father; Hypertension in her mother; Seizures in her sister.  Medications: Current Outpatient Medications  Medication Sig Dispense Refill   albuterol (VENTOLIN HFA) 108 (90 Base) MCG/ACT inhaler Inhale 1-2 puffs into the lungs every 4 (four) hours as needed for wheezing or shortness of breath. 18 g 11   amoxicillin-clavulanate (AUGMENTIN) 875-125 MG tablet Take 1 tablet by mouth 2 (two)  times daily. 14 tablet 0   JUNEL FE 1/20 1-20 MG-MCG tablet Take 1 tablet by mouth daily.     paragard intrauterine copper IUD IUD 1 each by Intrauterine route once.     No current facility-administered medications for this visit.   Allergies  Allergen Reactions   Azithromycin Hives and Rash   Erythromycin Rash   Buspirone Hcl Other (See Comments)    'felt weird'     If phone visit, billing and coding can please add appropriate modifier if needed

## 2021-04-22 NOTE — Progress Notes (Deleted)
   GYNECOLOGY OFFICE VISIT NOTE  History:   Samantha Ochoa is a 40 y.o. G2P2 here today for breast lump.   She denies any abnormal vaginal discharge, bleeding, pelvic pain or other concerns.     The following portions of the patient's history were reviewed and updated as appropriate: allergies, current medications, past family history, past medical history, past social history, past surgical history and problem list.   Health Maintenance:  Normal pap 09/2017 at [facility] - ***  Review of Systems:  Pertinent items noted in HPI and remainder of comprehensive ROS otherwise negative.  Physical Exam:  There were no vitals taken for this visit. CONSTITUTIONAL: Well-developed, well-nourished female in no acute distress.  HEENT:  Normocephalic, atraumatic. External right and left ear normal. No scleral icterus.  NECK: Normal range of motion, supple, no masses noted on observation BREAST: No skin changes, *** SKIN: No rash noted. Not diaphoretic. No erythema. No pallor. MUSCULOSKELETAL: Normal range of motion. No edema noted. NEUROLOGIC: Alert and oriented to person, place, and time. Normal muscle tone coordination. No cranial nerve deficit noted. PSYCHIATRIC: Normal mood and affect. Normal behavior. Normal judgment and thought content.  CARDIOVASCULAR: Normal heart rate noted RESPIRATORY: Effort and breath sounds normal, no problems with respiration noted ABDOMEN: No masses noted. No other overt distention noted.    PELVIC: Deferred  Labs and Imaging No results found for this or any previous visit (from the past 168 hour(s)). No results found.    Assessment and Plan:    1. Mass of breast, unspecified laterality ***   Routine preventative health maintenance measures emphasized. Please refer to After Visit Summary for other counseling recommendations.   No follow-ups on file.    I spent {Blank single:19197::"10","15","20","25","30"} minutes dedicated to the care of this patient  including pre-visit review of records, face to face time with the patient discussing her conditions and treatments and post visit orders.    Milas Hock, MD, FACOG Obstetrician & Gynecologist, [facility] for [facility], [facility]

## 2021-04-25 ENCOUNTER — Encounter: Payer: 59 | Admitting: Obstetrics and Gynecology

## 2021-04-28 NOTE — Progress Notes (Signed)
GYNECOLOGY OFFICE VISIT NOTE  History:   Samantha Ochoa is a 40 y.o. G2P2 here today for breast mass. It is smaller than a pea and tender. It has been there for a few months. She saw PCP who didn't feel it. She still feels it now. They have not changed in size.   From review of care everywhere she has had this since June. She was seen and dx mammogram recommended. She does have [facility] of breast cancer - mother. Grandmother had ovarian cancer.   She has copper IUD for birth control placed in 2015.   She denies any abnormal vaginal discharge, bleeding, pelvic pain or other concerns.   She lives at home with 2 kids and spouse.     Past Medical History:  Diagnosis Date   Anxiety    Asthma     Past Surgical History:  Procedure Laterality Date   BLADDER SURGERY  1991    The following portions of the patient's history were reviewed and updated as appropriate: allergies, current medications, past family history, past medical history, past social history, past surgical history and problem list.   Health Maintenance:  Normal pap and negative HRHPV on 2019.    Review of Systems:  Pertinent items noted in HPI and remainder of comprehensive ROS otherwise negative.  Physical Exam:  BP 121/79   Pulse 68   Resp 16   Ht 5' 5.5" (1.664 m)   Wt 161 lb (73 kg)   BMI 26.38 kg/m  CONSTITUTIONAL: Well-developed, well-nourished female in no acute distress.  HEENT:  Normocephalic, atraumatic. External right and left ear normal. No scleral icterus.  NECK: Normal range of motion, supple, no masses noted on observation SKIN: No rash noted. Not diaphoretic. No erythema. No pallor. MUSCULOSKELETAL: Normal range of motion. No edema noted. NEUROLOGIC: Alert and oriented to person, place, and time. Normal muscle tone coordination. No cranial nerve deficit noted. PSYCHIATRIC: Normal mood and affect. Normal behavior. Normal judgment and thought content.  Breasts: right breast normal except two small  masses on the inner breast near the midline at 3 oclock - one is 2-3 mm and one is 5 mm both of which are tender, skin or nipple changes or axillary nodes, left breast normal without mass, skin or nipple changes or axillary nodes.   CARDIOVASCULAR: Normal heart rate noted RESPIRATORY: Effort normal, no problems with respiration noted ABDOMEN: No masses noted. No other overt distention noted.    PELVIC: Deferred  Labs and Imaging No results found for this or any previous visit (from the past 168 hour(s)). No results found.    Assessment and Plan:    1. Mass of breast, unspecified laterality - Right side, present for months - Will check breast US/dx mammo.  - Reviewed reassuring findings of the exam, but given length of time, age and fmh, I would recommend imaging at this time.   2. Family history of breast cancer in mother - Has been referred to genetics recently - pt has not yet gone but would ultimately like to do genetic testing. Performed today.  - We discussed if BRCA positive the recommendations are bilateral mastectomy and RRSO +/- hysterectomy. Discussed pros/cons of hysterectomy - Discussed surgical menopause and HRT and risks of E+P HRT. Discussed the risk of E alone. Discussed that there are nonhormonal options for symptoms as well but less effective.  - Discussed if doesn't desires surgical management, the options would be for surveillance I.e. MXR and MRI, TVUS and Ca125  Routine preventative health maintenance measures emphasized. Please refer to After Visit Summary for other counseling recommendations.   No follow-ups on file.    I spent 20 minutes dedicated to the care of this patient including pre-visit review of records, face to face time with the patient discussing her conditions and treatments and post visit orders.  Radene Gunning, MD, [place] for [facility], [place]

## 2021-05-02 ENCOUNTER — Encounter: Payer: Self-pay | Admitting: Obstetrics and Gynecology

## 2021-05-02 ENCOUNTER — Ambulatory Visit: Payer: BC Managed Care – PPO | Admitting: Obstetrics and Gynecology

## 2021-05-02 ENCOUNTER — Other Ambulatory Visit: Payer: Self-pay

## 2021-05-02 VITALS — BP 121/79 | HR 68 | Resp 16 | Ht 65.5 in | Wt 161.0 lb

## 2021-05-02 DIAGNOSIS — Z803 Family history of malignant neoplasm of breast: Secondary | ICD-10-CM

## 2021-05-02 DIAGNOSIS — N63 Unspecified lump in unspecified breast: Secondary | ICD-10-CM | POA: Diagnosis not present

## 2021-05-16 ENCOUNTER — Telehealth: Payer: Self-pay

## 2021-05-16 NOTE — Telephone Encounter (Signed)
[employer] called (Phone: [phone]/ Reference ID [id number]) requesting more information about family's medical history in order to cover pt's genetic testing. VM left on pt's phone asking pt to return call to office to let us know if MGM is deceased, had genetic testing or declined genetic testing.

## 2021-06-12 ENCOUNTER — Ambulatory Visit
Admission: RE | Admit: 2021-06-12 | Discharge: 2021-06-12 | Disposition: A | Discharge status: Home / Self Care | Payer: BC Managed Care – PPO | Source: Ambulatory Visit | Attending: Obstetrics and Gynecology | Admitting: Obstetrics and Gynecology

## 2021-06-12 ENCOUNTER — Other Ambulatory Visit: Payer: Self-pay

## 2021-06-12 DIAGNOSIS — N63 Unspecified lump in unspecified breast: Secondary | ICD-10-CM

## 2021-06-28 ENCOUNTER — Ambulatory Visit: Payer: BC Managed Care – PPO | Admitting: Family Medicine

## 2021-07-23 ENCOUNTER — Encounter: Payer: Self-pay | Admitting: Family Medicine

## 2021-07-23 ENCOUNTER — Other Ambulatory Visit: Payer: Self-pay

## 2021-07-23 ENCOUNTER — Ambulatory Visit: Payer: BC Managed Care – PPO | Admitting: Family Medicine

## 2021-07-23 DIAGNOSIS — F418 Other specified anxiety disorders: Secondary | ICD-10-CM

## 2021-07-23 MED ORDER — FLUOXETINE HCL 20 MG PO CAPS: 20.0000 mg | ORAL_CAPSULE | Freq: Every day | ORAL | 0 refills | Status: DC

## 2021-07-28 ENCOUNTER — Encounter: Payer: Self-pay | Admitting: Family Medicine

## 2021-07-28 NOTE — Assessment & Plan Note (Signed)
Restarting fluoxetine at 20 mg daily.  Historically has done fairly well with this. Return in about 6 weeks (around 09/03/2021) for MDD/GAD.

## 2021-07-28 NOTE — Progress Notes (Signed)
Samantha Ochoa - 41 y.o. female MRN [id number]  Date of birth: 04-01-1981  Subjective No chief complaint on file.   HPI Samantha Ochoa is a 41 year old female here today for follow-up visit.  She has history of depression with anxiety which she has recently been managing without medication.  Notes increased stressors and feels like she needs to be back on medication.  Previously treated with fluoxetine.  She felt like this worked well for her however she did have some mild sexual side effects.  She has tried other medications in the past and did not tolerate well.    Depression screen [facility] 2/9 07/24/2021 07/31/2020 07/07/2019  Decreased Interest 2 0 1  Down, Depressed, Hopeless 2 0 1  PHQ - 2 Score 4 0 2  Altered sleeping 2 1 1   Tired, decreased energy 1 0 0  Change in appetite 3 1 2   Feeling bad or failure about yourself  1 0 0  Trouble concentrating 0 1 0  Moving slowly or fidgety/restless 0 0 0  Suicidal thoughts 1 0 0  PHQ-9 Score 12 3 5   Difficult doing work/chores Very difficult Somewhat difficult Somewhat difficult   GAD 7 : Generalized Anxiety Score 07/24/2021 07/31/2020 07/07/2019 06/09/2019  Nervous, Anxious, on Edge 3 0 1 3  Control/stop worrying 1 0 0 1  Worry too much - different things 1 0 1 1  Trouble relaxing 1 0 0 1  Restless - 0 0 0  Easily annoyed or irritable 3 1 1 3   Afraid - awful might happen 0 0 0 0  Total GAD 7 Score - 1 3 9   Anxiety Difficulty Very difficult Somewhat difficult Somewhat difficult Somewhat difficult   ROS:  A comprehensive ROS was completed and negative except as noted per HPI    Allergies  Allergen Reactions   Azithromycin Hives and Rash   Erythromycin Rash   Buspirone Hcl Other (See Comments)    'felt weird'    Past Medical History:  Diagnosis Date   Anxiety    Asthma     Past Surgical History:  Procedure Laterality Date   BLADDER SURGERY  1991    Social History   Socioeconomic History   Marital status: Married    Spouse  name: Not on file   Number of children: Not on file   Years of education: Not on file   Highest education level: Not on file  Occupational History   Not on file  Tobacco Use   Smoking status: Never   Smokeless tobacco: Never  Vaping Use   Vaping Use: Never used  Substance and Sexual Activity   Alcohol use: Yes    Alcohol/week: 1.0 standard drink    Types: 1 Standard drinks or equivalent per week   Drug use: Never   Sexual activity: Yes    Birth control/protection: I.U.D.  Other Topics Concern   Not on file  Social History Narrative   Not on file   Social Determinants of Health   Financial Resource Strain: Not on file  Food Insecurity: Not on file  Transportation Needs: Not on file  Physical Activity: Not on file  Stress: Not on file  Social Connections: Not on file    Family History  Problem Relation Age of Onset   Breast cancer Mother    Hypertension Mother    Heart disease Father    Seizures Sister     Health Maintenance  Topic Date Due   Pneumococcal Vaccine 56-20 Years old (1 -  PCV) Never done   HIV Screening  Never done   Hepatitis C Screening  Never done   COVID-19 Vaccine (4 - Booster for Moderna series) 08/13/2020   PAP SMEAR-Modifier  09/26/2020   INFLUENZA VACCINE  02/18/2021   TETANUS/TDAP  04/07/2027   HPV VACCINES  Aged Out     ----------------------------------------------------------------------------------------------------------------------------------------------------------------------------------------------------------------- Physical Exam BP 124/64 (BP Location: Left Arm, Patient Position: Sitting, Cuff Size: Normal)    Pulse 66    Temp 97.8 F (36.6 C)    Ht 5' 5.5" (1.664 m)    Wt 175 lb (79.4 kg)    SpO2 99%    BMI 28.68 kg/m   Physical Exam Constitutional:      Appearance: Normal appearance.  Eyes:     General: No scleral icterus. Musculoskeletal:     Cervical back: Neck supple.  Neurological:     Mental Status: She is  alert.  Psychiatric:        Mood and Affect: Mood normal.        Behavior: Behavior normal.    ------------------------------------------------------------------------------------------------------------------------------------------------------------------------------------------------------------------- Assessment and Plan  Anxiety with depression Restarting fluoxetine at 20 mg daily.  Historically has done fairly well with this. Return in about 6 weeks (around 09/03/2021) for MDD/GAD.   Meds ordered this encounter  Medications   FLUoxetine (PROZAC) 20 MG capsule    Sig: Take 1 capsule (20 mg total) by mouth daily.    Dispense:  90 capsule    Refill:  0    Return in about 6 weeks (around 09/03/2021) for MDD/GAD.    This visit occurred during the SARS-CoV-2 public health emergency.  Safety protocols were in place, including screening questions prior to the visit, additional usage of staff PPE, and extensive cleaning of exam room while observing appropriate contact time as indicated for disinfecting solutions.

## 2021-09-03 ENCOUNTER — Ambulatory Visit: Payer: BC Managed Care – PPO | Admitting: Family Medicine

## 2021-09-03 ENCOUNTER — Other Ambulatory Visit: Payer: Self-pay

## 2021-09-03 ENCOUNTER — Encounter: Payer: Self-pay | Admitting: Family Medicine

## 2021-09-03 VITALS — BP 120/70 | HR 68 | Temp 97.6°F | Ht 65.5 in | Wt 175.0 lb

## 2021-09-03 DIAGNOSIS — F418 Other specified anxiety disorders: Secondary | ICD-10-CM

## 2021-09-03 DIAGNOSIS — Z23 Encounter for immunization: Secondary | ICD-10-CM | POA: Diagnosis not present

## 2021-09-03 DIAGNOSIS — R41840 Attention and concentration deficit: Secondary | ICD-10-CM | POA: Diagnosis not present

## 2021-09-03 DIAGNOSIS — F411 Generalized anxiety disorder: Secondary | ICD-10-CM

## 2021-09-03 MED ORDER — FLUOXETINE HCL 20 MG PO CAPS: 20.0000 mg | ORAL_CAPSULE | Freq: Every day | ORAL | 1 refills | Status: DC

## 2021-09-08 NOTE — Assessment & Plan Note (Signed)
Stable with fluoxetine at current strength.  We will continue fluoxetine and place referral to psychology for talk therapy. Return in about 3 months (around 12/01/2021) for MDD/GAD.

## 2021-09-08 NOTE — Progress Notes (Signed)
Samantha Ochoa - 41 y.o. female MRN [id number]  Date of birth: Feb 09, 1981  Subjective No chief complaint on file.   HPI Samantha Ochoa is a 41 year old female here today for follow-up visit.  She has history of depression and anxiety which is currently treated with fluoxetine.  She feels that she is doing okay with fluoxetine however is requesting referral for counseling.  She has not had any issues with tolerance related to fluoxetine other than anorgasmia.  She would like to stick with this as this is worked better than other medication she has tried in the past.  ROS:  A comprehensive ROS was completed and negative except as noted per HPI  Allergies  Allergen Reactions   Azithromycin Hives and Rash   Erythromycin Rash   Buspirone Hcl Other (See Comments)    'felt weird'    Past Medical History:  Diagnosis Date   Anxiety    Asthma     Past Surgical History:  Procedure Laterality Date   BLADDER SURGERY  1991    Social History   Socioeconomic History   Marital status: Married    Spouse name: Not on file   Number of children: Not on file   Years of education: Not on file   Highest education level: Not on file  Occupational History   Not on file  Tobacco Use   Smoking status: Never   Smokeless tobacco: Never  Vaping Use   Vaping Use: Never used  Substance and Sexual Activity   Alcohol use: Yes    Alcohol/week: 1.0 standard drink    Types: 1 Standard drinks or equivalent per week   Drug use: Never   Sexual activity: Yes    Birth control/protection: I.U.D.  Other Topics Concern   Not on file  Social History Narrative   Not on file   Social Determinants of Health   Financial Resource Strain: Not on file  Food Insecurity: Not on file  Transportation Needs: Not on file  Physical Activity: Not on file  Stress: Not on file  Social Connections: Not on file    Family History  Problem Relation Age of Onset   Breast cancer Mother    Hypertension Mother     Heart disease Father    Seizures Sister     Health Maintenance  Topic Date Due   HIV Screening  Never done   Hepatitis C Screening  Never done   PAP SMEAR-Modifier  09/26/2020   COVID-19 Vaccine (4 - Booster for Moderna series) 09/20/2022 (Originally 08/13/2020)   TETANUS/TDAP  04/07/2027   INFLUENZA VACCINE  Completed   HPV VACCINES  Aged Out     ----------------------------------------------------------------------------------------------------------------------------------------------------------------------------------------------------------------- Physical Exam BP 120/70 (BP Location: Left Arm, Patient Position: Sitting, Cuff Size: Normal)    Pulse 68    Temp 97.6 F (36.4 C)    Ht 5' 5.5" (1.664 m)    Wt 175 lb (79.4 kg)    SpO2 98%    BMI 28.68 kg/m   Physical Exam Constitutional:      Appearance: Normal appearance.  Musculoskeletal:     Cervical back: Neck supple.  Neurological:     Mental Status: She is alert.  Psychiatric:        Mood and Affect: Mood normal.        Behavior: Behavior normal.    ------------------------------------------------------------------------------------------------------------------------------------------------------------------------------------------------------------------- Assessment and Plan  Anxiety with depression Stable with fluoxetine at current strength.  We will continue fluoxetine and place referral to psychology for talk  therapy. Return in about 3 months (around 12/01/2021) for MDD/GAD.   Meds ordered this encounter  Medications   FLUoxetine (PROZAC) 20 MG capsule    Sig: Take 1 capsule (20 mg total) by mouth daily.    Dispense:  90 capsule    Refill:  1    Return in about 3 months (around 12/01/2021) for MDD/GAD.    This visit occurred during the SARS-CoV-2 public health emergency.  Safety protocols were in place, including screening questions prior to the visit, additional usage of staff PPE, and extensive  cleaning of exam room while observing appropriate contact time as indicated for disinfecting solutions.

## 2021-12-02 ENCOUNTER — Encounter: Payer: Self-pay | Admitting: Family Medicine

## 2021-12-02 ENCOUNTER — Ambulatory Visit: Payer: BC Managed Care – PPO | Admitting: Family Medicine

## 2021-12-02 VITALS — BP 113/71 | HR 62 | Ht 65.5 in | Wt 180.0 lb

## 2021-12-02 DIAGNOSIS — F418 Other specified anxiety disorders: Secondary | ICD-10-CM | POA: Diagnosis not present

## 2021-12-02 DIAGNOSIS — K148 Other diseases of tongue: Secondary | ICD-10-CM

## 2021-12-02 NOTE — Assessment & Plan Note (Signed)
She has [place] from fluoxetine.  Given information regarding EAP program available through her employer.  Some of her symptoms may be coming from untreated ADD as well.  We will await evaluation of this. ?

## 2021-12-02 NOTE — Progress Notes (Signed)
?Samantha Ochoa - 41 y.o. female MRN [id number]  Date of birth: Nov 28, 1980 ? ?Subjective ?Chief Complaint  ?Patient presents with  ? Mood  ? ? ?HPI ?Samantha Ochoa is a 41 year old female here today for follow-up visit.  Since her last visit reports she has been doing well.  She has weaned herself from fluoxetine feels pretty well off of this.  She does have upcoming evaluation for possible ADD/ ADHD.  She thinks she may be interested in seeing a therapist to help with her anxiety.  She has not looked into EAP program that is available through [facility] yet. ? ?She does also have a lesion on her tongue that has been present for several weeks.  Area is painful.  She is requesting referral for this. ? ?ROS:  A comprehensive ROS was completed and negative except as noted per HPI ? ?Allergies  ?Allergen Reactions  ? Azithromycin Hives and Rash  ? Erythromycin Rash  ? Buspirone Hcl Other (See Comments)  ?  'felt weird'  ? ? ?Past Medical History:  ?Diagnosis Date  ? Anxiety   ? Asthma   ? ? ?Past Surgical History:  ?Procedure Laterality Date  ? BLADDER SURGERY  1991  ? ? ?Social History  ? ?Socioeconomic History  ? Marital status: Married  ?  Spouse name: Not on file  ? Number of children: Not on file  ? Years of education: Not on file  ? Highest education level: Not on file  ?Occupational History  ? Not on file  ?Tobacco Use  ? Smoking status: Never  ? Smokeless tobacco: Never  ?Vaping Use  ? Vaping Use: Never used  ?Substance and Sexual Activity  ? Alcohol use: Yes  ?  Alcohol/week: 1.0 standard drink  ?  Types: 1 Standard drinks or equivalent per week  ? Drug use: Never  ? Sexual activity: Yes  ?  Birth control/protection: I.U.D.  ?Other Topics Concern  ? Not on file  ?Social History Narrative  ? Not on file  ? ?Social Determinants of Health  ? ?Financial Resource Strain: Not on file  ?Food Insecurity: Not on file  ?Transportation Needs: Not on file  ?Physical Activity: Not on file   ?Stress: Not on file  ?Social Connections: Not on file  ? ? ?Family History  ?Problem Relation Age of Onset  ? Breast cancer Mother   ? Hypertension Mother   ? Heart disease Father   ? Seizures Sister   ? ? ?Health Maintenance  ?Topic Date Due  ? HIV Screening  Never done  ? Hepatitis C Screening  Never done  ? PAP SMEAR-Modifier  09/26/2020  ? COVID-19 Vaccine (4 - Booster for Moderna series) 09/20/2022 (Originally 08/13/2020)  ? INFLUENZA VACCINE  02/18/2022  ? TETANUS/TDAP  04/07/2027  ? HPV VACCINES  Aged Out  ? ? ? ?----------------------------------------------------------------------------------------------------------------------------------------------------------------------------------------------------------------- ?Physical Exam ?BP 113/71 (BP Location: Left Arm, Patient Position: Sitting, Cuff Size: Large)   Pulse 62   Ht 5' 5.5" (1.664 m)   Wt 180 lb (81.6 kg)   SpO2 100%   BMI 29.50 kg/m?  ? ?Physical Exam ?Constitutional:   ?   Appearance: Normal appearance.  ?HENT:  ?   Head: Normocephalic and atraumatic.  ?Eyes:  ?   General: No scleral icterus. ?Cardiovascular:  ?   Rate and Rhythm: Normal rate and regular rhythm.  ?Pulmonary:  ?   Effort: Pulmonary effort is normal.  ?   Breath sounds: Normal breath sounds.  ?  Musculoskeletal:  ?   Cervical back: Neck supple.  ?Neurological:  ?   Mental Status: She is alert.  ?Psychiatric:     ?   Mood and Affect: Mood normal.     ?   Behavior: Behavior normal.  ? ? ?------------------------------------------------------------------------------------------------------------------------------------------------------------------------------------------------------------------- ?Assessment and Plan ? ?Anxiety with depression ?She has [place] from fluoxetine.  Given information regarding EAP program available through her employer.  Some of her symptoms may be coming from untreated ADD as well.  We will await evaluation of this. ? ?Tongue lesion ?Referral  placed to ENT ? ? ?No orders of the defined types were placed in this encounter. ? ? ?No follow-ups on file. ? ? ? ?This visit occurred during the SARS-CoV-2 public health emergency.  Safety protocols were in place, including screening questions prior to the visit, additional usage of staff PPE, and extensive cleaning of exam room while observing appropriate contact time as indicated for disinfecting solutions.  ? ?

## 2021-12-02 NOTE — Assessment & Plan Note (Signed)
Referral placed to ENT

## 2021-12-04 ENCOUNTER — Ambulatory Visit (INDEPENDENT_AMBULATORY_CARE_PROVIDER_SITE_OTHER): Payer: BC Managed Care – PPO | Admitting: Psychology

## 2021-12-04 DIAGNOSIS — F89 Unspecified disorder of psychological development: Secondary | ICD-10-CM | POA: Diagnosis not present
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
## 2021-12-11 ENCOUNTER — Ambulatory Visit: Payer: BC Managed Care – PPO | Admitting: Psychology

## 2021-12-11 DIAGNOSIS — F89 Unspecified disorder of psychological development: Secondary | ICD-10-CM

## 2021-12-11 NOTE — Progress Notes (Signed)
Date: 12/11/2021   Appointment Start Time: 4pm Duration: 65 minutes Provider: Helmut Muster, PsyD Type of Session: Testing Appointment for Evaluation  Location of Patient: Home Location of Provider: Provider's Home (private office) Type of Contact: WebEx video visit with audio  Session Content: Today's appointment was a telepsychological visit due to COVID-19. Samantha Ochoa is aware it is her responsibility to secure confidentiality on her end of the session. Prior to proceeding with today's appointment, Samantha Ochoa's physical location at the time of this appointment was obtained as well a phone number she could be reached at in the event of technical difficulties. Tuesday denied anyone else being present in the room or on the virtual appointment. This provider reviewed that video should not be captured, photos should not be taken, nor should testing stimuli be copied or recorded as it would be a copyright violation. Samantha Ochoa expressed understanding of the aforementioned, and verbally consented to proceed. The WAIS-IV was administered, scored, and interpreted by this evaluator  Billing codes will be input on the feedback appointment. There are no billing codes for the testing appointment.   Mental Status Examination:  Appearance:  neat Behavior: appropriate to circumstances Mood: anxious Affect: mood congruent Speech: WNL Eye Contact: appropriate Psychomotor Activity: restless Thought Process: linear, logical, and goal directed and denies suicidal, homicidal, and self-harm ideation, plan and intent Content/Perceptual Disturbances: none Orientation: AAOx4 Cognition/Sensorium: intact Insight: good Judgment: good  Provisional DSM-5 diagnosis(es):  F89 Unspecified Disorder of Psychological Development   Plan: Samantha Ochoa was scheduled for a feedback appointment on 12/18/2021 at 3pm via WebEx video visit with audio.                Margarite Gouge, PsyD

## 2021-12-18 ENCOUNTER — Ambulatory Visit: Payer: BC Managed Care – PPO | Admitting: Psychology

## 2021-12-18 DIAGNOSIS — F900 Attention-deficit hyperactivity disorder, predominantly inattentive type: Secondary | ICD-10-CM | POA: Diagnosis not present
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
## 2022-01-27 ENCOUNTER — Encounter: Payer: Self-pay | Admitting: Family Medicine

## 2022-01-27 DIAGNOSIS — E6609 Other obesity due to excess calories: Secondary | ICD-10-CM

## 2022-04-28 NOTE — Telephone Encounter (Signed)
Erroneous error

## 2022-05-15 ENCOUNTER — Ambulatory Visit
Admission: EM | Admit: 2022-05-15 | Discharge: 2022-05-15 | Disposition: A | Discharge status: Home / Self Care | Payer: BC Managed Care – PPO

## 2022-05-15 DIAGNOSIS — R059 Cough, unspecified: Secondary | ICD-10-CM

## 2022-05-15 MED ORDER — PROMETHAZINE-DM 6.25-15 MG/5ML PO SYRP
5.0000 mL | ORAL_SOLUTION | Freq: Two times a day (BID) | ORAL | 0 refills | Status: DC | PRN
Start: 2022-05-15 — End: 2023-01-01

## 2022-05-15 MED ORDER — AMOXICILLIN 875 MG PO TABS: 875.0000 mg | ORAL_TABLET | Freq: Two times a day (BID) | ORAL | 0 refills | Status: AC

## 2022-05-15 NOTE — ED Triage Notes (Signed)
Pt c/o cough x 3 weeks. Mucinex prn and Benedryl at night. Hx of asthma.

## 2022-05-15 NOTE — ED Provider Notes (Signed)
[facility]    CSN: [id number] Arrival date & time: 05/15/22  1618      History   Chief Complaint Chief Complaint  Patient presents with   Cough    HPI KENZEE Ochoa is a 41 y.o. female.   HPI Pleasant 41 year old female presents with cough for 3 weeks.  PMH significant for obesity, asthma, and anxiety.  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 Authorizing Provider  amoxicillin (AMOXIL) 875 MG tablet Take 1 tablet (875 mg total) by mouth 2 (two) times daily for 10 days. 05/15/22 05/25/22 Yes Eliezer Lofts, FNP  promethazine-dextromethorphan (PROMETHAZINE-DM) 6.25-15 MG/5ML syrup Take 5 mLs by mouth 2 (two) times daily as needed for cough. 05/15/22  Yes Eliezer Lofts, FNP  albuterol (VENTOLIN HFA) 108 (90 Base) MCG/ACT inhaler Inhale 1-2 puffs into the lungs every 4 (four) hours as needed for wheezing or shortness of breath. 07/31/20   Emeterio Reeve, DO  Calcium Carb-Cholecalciferol 600-400 MG-UNIT TABS     [provider]  FLUoxetine (PROZAC) 20 MG capsule     [provider]  paragard intrauterine copper IUD IUD 1 each by Intrauterine route once.    [provider]  topiramate (TOPAMAX) 25 MG tablet Take by mouth. 05/01/22   [provider]  clonazePAM (KLONOPIN) 0.5 MG tablet Take 1 tablet (0.5  mg total) by mouth 2 (two) times daily as needed (Anxiety or twitches/ticks). Patient not taking: Reported on 07/31/2020 04/27/19 07/31/20  Gregor Hams, MD    Family History Family History  Problem Relation Age of Onset   Breast cancer Mother    Hypertension Mother    Heart disease Father    Seizures Sister     Social History Social History   Tobacco Use   Smoking status: Never   Smokeless tobacco: Never  Vaping Use   Vaping Use: Never used  Substance Use Topics   Alcohol use: Yes    Alcohol/week: 1.0 standard drink of alcohol    Types: 1 Standard drinks or equivalent per week   Drug use: Never     Allergies   Azithromycin, Erythromycin, and Buspirone hcl   Review of Systems Review of Systems  Respiratory:  Positive for cough.   All other systems reviewed and are negative.    Physical Exam Triage Vital Signs ED Triage Vitals  Enc Vitals Group     BP 05/15/22 1626 108/76     Pulse Rate 05/15/22 1626 80     Resp 05/15/22 1626  17     Temp 05/15/22 1626 98.6 F (37 C)     Temp Source 05/15/22 1626 Oral     SpO2 05/15/22 1626 97 %     Weight --      Height --      Head Circumference --      Peak Flow --      Pain Score 05/15/22 1631 0     Pain Loc --      Pain Edu? --      Excl. in [place]? --    No data found.  Updated Vital Signs BP 108/76 (BP Location: Right Arm)   Pulse 80   Temp 98.6 F (37 C) (Oral)   Resp 17   SpO2 97%    Physical Exam Vitals and nursing note reviewed.  Constitutional:      General: She is not in acute distress.    Appearance: Normal appearance. She is normal weight. She is not ill-appearing.  HENT:     Head: Normocephalic and atraumatic.     Right Ear: Tympanic membrane and external ear normal.     Left Ear: Tympanic membrane and external ear normal.     Ears:     Comments: Moderate eustachian tube dysfunction noted bilaterally    Mouth/Throat:     Mouth: Mucous membranes are moist.     Pharynx: Oropharynx is clear.   Eyes:     Conjunctiva/sclera: Conjunctivae normal.     Pupils: Pupils are equal, round, and reactive to light.  Cardiovascular:     Rate and Rhythm: Normal rate and regular rhythm.     Pulses: Normal pulses.     Heart sounds: Normal heart sounds.  Pulmonary:     Effort: Pulmonary effort is normal.     Breath sounds: Normal breath sounds. No wheezing, rhonchi or rales.     Comments: Infrequent nonproductive cough noted on exam Musculoskeletal:        General: Normal range of motion.     Cervical back: Normal range of motion and neck supple.  Skin:    General: Skin is warm and dry.  Neurological:     General: No focal deficit present.     Mental Status: She is alert and oriented to person, place, and time. Mental status is at baseline.      UC Treatments / Results  Labs (all labs ordered are listed, but only abnormal results are displayed) Labs Reviewed - No data to display  EKG   Radiology No results found.  Procedures Procedures (including critical care time)  Medications Ordered in UC Medications - No data to display  Initial Impression / Assessment and Plan / UC Course  I have reviewed the triage vital signs and the nursing notes.  Pertinent labs & imaging results that were available during my care of the patient were reviewed by me and considered in my medical decision making (see chart for details).     MDM: 1.  Cough, unspecified-Rx'd Amoxicillin, Promethazine DM. Advised patient to take medication as directed with food to completion.  Advised patient may use Promethazine DM for cough prior to sleep due to sedative effects.  Encouraged patient increase daily water intake while taking these medications.  Advised patient if symptoms worsen and/or unresolved please follow-up with PCP or here for further evaluation.  Patient discharged home, hemodynamically stable. Final Clinical Impressions(s) / UC Diagnoses   Final diagnoses:  Cough, unspecified type      Discharge Instructions  Advised patient to take medication as directed with food to completion.  Advised patient may use Promethazine DM for cough prior to sleep due to sedative effects.  Encouraged patient increase daily water intake while taking these medications.  Advised patient if symptoms worsen and/or unresolved please follow-up with PCP or here for further evaluation.     ED Prescriptions     Medication Sig Dispense Auth. Provider   amoxicillin (AMOXIL) 875 MG tablet Take 1 tablet (875 mg total) by mouth 2 (two) times daily for 10 days. 20 tablet Eliezer Lofts, FNP   promethazine-dextromethorphan (PROMETHAZINE-DM) 6.25-15 MG/5ML syrup Take 5 mLs by mouth 2 (two) times daily as needed for cough. 118 mL Eliezer Lofts, FNP      PDMP not reviewed this encounter.   Eliezer Lofts, [place] 05/15/22 1710

## 2022-05-15 NOTE — Discharge Instructions (Addendum)
Advised patient to take medication as directed with food to completion.  Advised patient may use Promethazine DM for cough prior to sleep due to sedative effects.  Encouraged patient increase daily water intake while taking these medications.  Advised patient if symptoms worsen and/or unresolved please follow-up with PCP or here for further evaluation.

## 2022-05-16 ENCOUNTER — Telehealth: Payer: Self-pay | Admitting: Emergency Medicine

## 2022-05-16 NOTE — Telephone Encounter (Signed)
[place].  Advised that if doing well to disregard the call.  Any questions or concerns feel free to give the office a call.

## 2022-05-21 ENCOUNTER — Ambulatory Visit: Payer: BC Managed Care – PPO | Admitting: Family Medicine

## 2022-05-21 ENCOUNTER — Encounter: Payer: Self-pay | Admitting: Family Medicine

## 2022-05-21 VITALS — BP 104/69 | HR 67 | Ht 65.5 in | Wt 186.0 lb

## 2022-05-21 DIAGNOSIS — F418 Other specified anxiety disorders: Secondary | ICD-10-CM | POA: Diagnosis not present

## 2022-05-21 DIAGNOSIS — Z23 Encounter for immunization: Secondary | ICD-10-CM | POA: Diagnosis not present

## 2022-05-21 DIAGNOSIS — Z6791 Unspecified blood type, Rh negative: Secondary | ICD-10-CM | POA: Insufficient documentation

## 2022-05-21 MED ORDER — FLUOXETINE HCL 40 MG PO CAPS: 40.0000 mg | ORAL_CAPSULE | Freq: Every day | ORAL | 1 refills | Status: DC

## 2022-05-21 NOTE — Assessment & Plan Note (Signed)
She has historically done well with fluoxetine however does not feel like current dose is working well enough for her.  Increasing to 40 mg daily.  We will plan to see her back in about 4 to 5 weeks to review how she is doing with the change in dose.

## 2022-05-21 NOTE — Progress Notes (Signed)
Samantha Ochoa - 41 y.o. female MRN [id number]  Date of birth: 1981/02/05  Subjective Chief Complaint  Patient presents with   Medication Problem    HPI Samantha Ochoa is a 41 year old female here today for follow-up visit.  She reports that she has restarted fluoxetine due to some worsening depressive symptoms.  Currently taking 20 mg daily.  She has noticed that this seems to making some difference however she is still having quite a bit of symptoms.  She typically enjoys running however finds that she does not enjoy this as much as she typically would.  She has not had any side effects with fluoxetine.  ROS:  A comprehensive ROS was completed and negative except as noted per HPI  Allergies  Allergen Reactions   Azithromycin Hives and Rash   Erythromycin Rash   Buspirone Hcl Other (See Comments)    'felt weird'    Past Medical History:  Diagnosis Date   Anxiety    Asthma     Past Surgical History:  Procedure Laterality Date   BLADDER SURGERY  1991    Social History   Socioeconomic History   Marital status: Married    Spouse name: Not on file   Number of children: Not on file   Years of education: Not on file   Highest education level: Not on file  Occupational History   Not on file  Tobacco Use   Smoking status: Never   Smokeless tobacco: Never  Vaping Use   Vaping Use: Never used  Substance and Sexual Activity   Alcohol use: Yes    Alcohol/week: 1.0 standard drink of alcohol    Types: 1 Standard drinks or equivalent per week   Drug use: Never   Sexual activity: Yes    Birth control/protection: I.U.D.  Other Topics Concern   Not on file  Social History Narrative   Not on file   Social Determinants of Health   Financial Resource Strain: Not on file  Food Insecurity: Not on file  Transportation Needs: Not on file  Physical Activity: Not on file  Stress: Not on file  Social Connections: Not on file    Family History  Problem Relation Age of Onset    Breast cancer Mother    Hypertension Mother    Heart disease Father    Seizures Sister     Health Maintenance  Topic Date Due   PAP SMEAR-Modifier  08/21/2022 (Originally 09/26/2020)   COVID-19 Vaccine (4 - Moderna series) 09/20/2022 (Originally 08/13/2020)   INFLUENZA VACCINE  10/19/2022 (Originally 02/18/2022)   Hepatitis C Screening  05/22/2023 (Originally 12/29/1999)   HIV Screening  05/22/2023 (Originally 12/28/1996)   TETANUS/TDAP  04/07/2027   HPV VACCINES  Aged Out     ----------------------------------------------------------------------------------------------------------------------------------------------------------------------------------------------------------------- Physical Exam BP 104/69 (BP Location: Right Arm, Patient Position: Sitting, Cuff Size: Large)   Pulse 67   Ht 5' 5.5" (1.664 m)   Wt 186 lb (84.4 kg)   SpO2 97%   BMI 30.48 kg/m   Physical Exam Constitutional:      Appearance: Normal appearance.  Eyes:     General: No scleral icterus. Cardiovascular:     Rate and Rhythm: Normal rate and regular rhythm.  Pulmonary:     Effort: Pulmonary effort is normal.     Breath sounds: Normal breath sounds.  Musculoskeletal:     Cervical back: Neck supple.  Neurological:     General: No focal deficit present.     Mental Status: She is alert.  Psychiatric:        Mood and Affect: Mood normal.        Behavior: Behavior normal.     ------------------------------------------------------------------------------------------------------------------------------------------------------------------------------------------------------------------- Assessment and Plan  Anxiety with depression She has historically done well with fluoxetine however does not feel like current dose is working well enough for her.  Increasing to 40 mg daily.  We will plan to see her back in about 4 to 5 weeks to review how she is doing with the change in dose.   Meds ordered this  encounter  Medications   FLUoxetine (PROZAC) 40 MG capsule    Sig: Take 1 capsule (40 mg total) by mouth daily.    Dispense:  90 capsule    Refill:  1    Return in about 4 weeks (around 06/18/2022) for F/u mood.    This visit occurred during the SARS-CoV-2 public health emergency.  Safety protocols were in place, including screening questions prior to the visit, additional usage of staff PPE, and extensive cleaning of exam room while observing appropriate contact time as indicated for disinfecting solutions.

## 2022-06-18 ENCOUNTER — Ambulatory Visit: Payer: BC Managed Care – PPO | Admitting: Family Medicine

## 2022-07-01 ENCOUNTER — Other Ambulatory Visit: Payer: Self-pay | Admitting: Family Medicine

## 2022-07-01 DIAGNOSIS — J45990 Exercise induced bronchospasm: Secondary | ICD-10-CM

## 2022-07-01 MED ORDER — ALBUTEROL SULFATE HFA 108 (90 BASE) MCG/ACT IN AERS: 1.0000 | INHALATION_SPRAY | RESPIRATORY_TRACT | 11 refills | Status: AC | PRN

## 2022-07-02 ENCOUNTER — Encounter: Payer: Self-pay | Admitting: Family Medicine

## 2022-07-02 ENCOUNTER — Ambulatory Visit: Payer: BC Managed Care – PPO | Admitting: Family Medicine

## 2022-07-02 VITALS — BP 97/67 | HR 77 | Ht 65.5 in | Wt 176.0 lb

## 2022-07-02 DIAGNOSIS — F418 Other specified anxiety disorders: Secondary | ICD-10-CM

## 2022-07-02 DIAGNOSIS — M79671 Pain in right foot: Secondary | ICD-10-CM | POA: Insufficient documentation

## 2022-07-02 MED ORDER — FLUOXETINE HCL 40 MG PO CAPS: 40.0000 mg | ORAL_CAPSULE | Freq: Every day | ORAL | 1 refills | Status: DC

## 2022-07-02 NOTE — Assessment & Plan Note (Signed)
Given handout on foot and ankle rehab exercises.  Recommend icing after exercise.  If not improving recommend follow-up with Dr. Benjamin Stain.

## 2022-07-02 NOTE — Assessment & Plan Note (Signed)
Doing well with current strength of fluoxetine 40 mg daily.  Recommend continuation.  Follow-up in 6 months or sooner if needed.

## 2022-07-02 NOTE — Progress Notes (Signed)
Samantha Ochoa - 41 y.o. female MRN [id number]  Date of birth: [birth year]  Subjective Chief Complaint  Patient presents with   Follow-up    prozac    HPI Samantha Ochoa is a 41 year old female here today for follow-up visit.  She reports she is doing well at this time.  Feels pretty well on fluoxetine at current strength.  She has felt more motivated and energetic overall, although she feels a little more tired this week.  She denies any significant side effects at this time.  She is looking at getting back into running as well.  She has noted some pain across the top of her right foot recently.  This does seem to be getting a little better.  No notable swelling at this time.  She is currently taking Wegovy to help with weight management.  This is being prescribed by weight management provider.  Doing well so far with this.  ROS:  A comprehensive ROS was completed and negative except as noted per HPI  Allergies  Allergen Reactions   Azithromycin Hives and Rash   Erythromycin Rash   Buspirone Hcl Other (See Comments)    'felt weird'    Past Medical History:  Diagnosis Date   Anxiety    Asthma     Past Surgical History:  Procedure Laterality Date   BLADDER SURGERY  1991    Social History   Socioeconomic History   Marital status: Married    Spouse name: Not on file   Number of children: Not on file   Years of education: Not on file   Highest education level: Not on file  Occupational History   Not on file  Tobacco Use   Smoking status: Never   Smokeless tobacco: Never  Vaping Use   Vaping Use: Never used  Substance and Sexual Activity   Alcohol use: Yes    Alcohol/week: 1.0 standard drink of alcohol    Types: 1 Standard drinks or equivalent per week   Drug use: Never   Sexual activity: Yes    Birth control/protection: I.U.D.  Other Topics Concern   Not on file  Social History Narrative   Not on file   Social Determinants of Health   Financial Resource  Strain: Not on file  Food Insecurity: Not on file  Transportation Needs: Not on file  Physical Activity: Not on file  Stress: Not on file  Social Connections: Not on file    Family History  Problem Relation Age of Onset   Breast cancer Mother    Hypertension Mother    Heart disease Father    Seizures Sister     Health Maintenance  Topic Date Due   COVID-19 Vaccine (4 - 2023-24 season) 03/21/2022   PAP SMEAR-Modifier  08/21/2022 (Originally 09/26/2020)   INFLUENZA VACCINE  10/19/2022 (Originally 02/18/2022)   Hepatitis C Screening  05/22/2023 (Originally 12/29/1999)   HIV Screening  05/22/2023 (Originally 12/28/1996)   DTaP/Tdap/Td (5 - Td or Tdap) 04/07/2027   HPV VACCINES  Aged Out     ----------------------------------------------------------------------------------------------------------------------------------------------------------------------------------------------------------------- Physical Exam BP 97/67   Pulse 77   Ht 5' 5.5" (1.664 m)   Wt 176 lb (79.8 kg)   SpO2 98%   BMI 28.84 kg/m   Physical Exam Constitutional:      Appearance: Normal appearance.  HENT:     Head: Normocephalic and atraumatic.  Neurological:     Mental Status: She is alert.  Psychiatric:        Mood and Affect:  Mood normal.        Behavior: Behavior normal.     ------------------------------------------------------------------------------------------------------------------------------------------------------------------------------------------------------------------- Assessment and Plan  Right foot pain Given handout on foot and ankle rehab exercises.  Recommend icing after exercise.  If not improving recommend follow-up with Dr. Benjamin Stain.  Anxiety with depression Doing well with current strength of fluoxetine 40 mg daily.  Recommend continuation.  Follow-up in 6 months or sooner if needed.   Meds ordered this encounter  Medications   FLUoxetine (PROZAC) 40 MG capsule     Sig: Take 1 capsule (40 mg total) by mouth daily.    Dispense:  90 capsule    Refill:  1    Return in about 6 months (around 01/01/2023) for F/u mood.    This visit occurred during the SARS-CoV-2 public health emergency.  Safety protocols were in place, including screening questions prior to the visit, additional usage of staff PPE, and extensive cleaning of exam room while observing appropriate contact time as indicated for disinfecting solutions.

## 2022-09-04 ENCOUNTER — Telehealth: Payer: Self-pay | Admitting: Family Medicine

## 2022-09-04 NOTE — Telephone Encounter (Signed)
Pt called at [phone] to request refill on Fluoxetine. Pharmacy on file is correct.

## 2022-09-04 NOTE — Telephone Encounter (Signed)
There is already a prescription on file. I called and confirmed this with the pharmacy. I called the patient and advised her of the prescription already with the pharmacy. Advised patient to call pharmacy for refills.

## 2022-12-01 IMAGING — MG DIGITAL DIAGNOSTIC BILAT W/ TOMO W/ CAD
6 of 12 series · 6 of 36 positions shown · non-contrast
Comparison: None.

CLINICAL DATA: The patient presents with a palpable lump in the
right breast.

EXAM:
DIGITAL DIAGNOSTIC BILATERAL MAMMOGRAM WITH TOMOSYNTHESIS AND CAD;
ULTRASOUND RIGHT BREAST LIMITED
TECHNIQUE: Bilateral digital diagnostic mammography and breast tomosynthesis
was performed. The images were evaluated with computer-aided
detection.; Targeted ultrasound examination of the right breast was
performed

[R MLO synth-2D (1 of 2)]
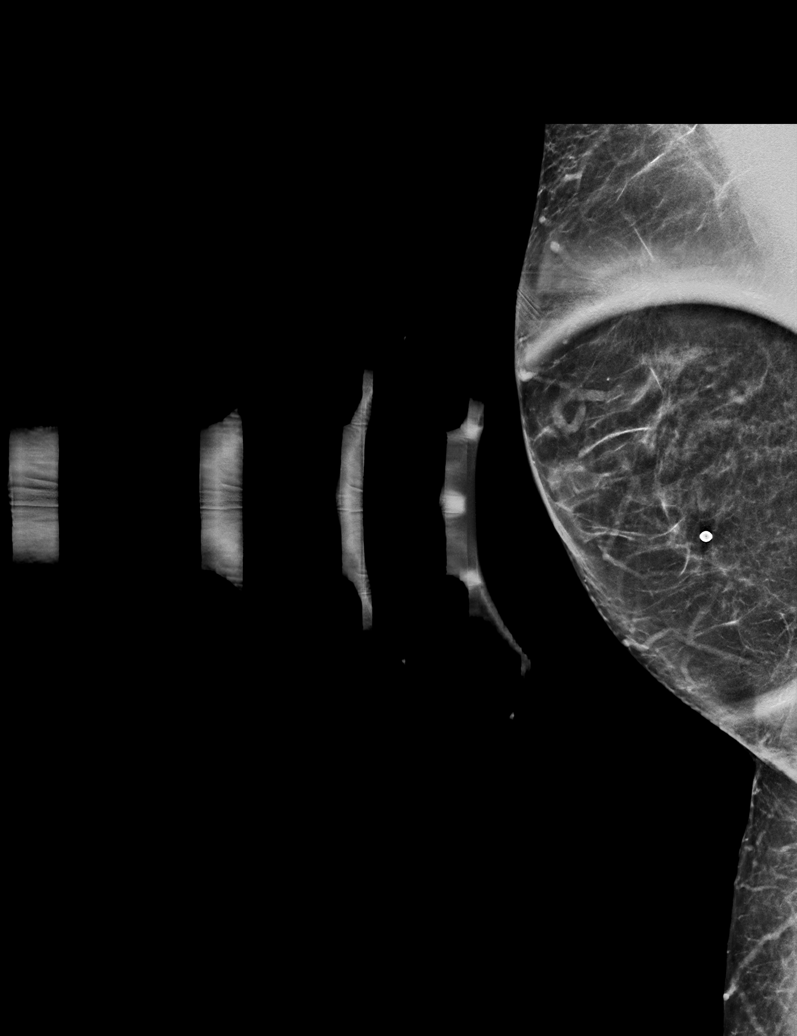

[L CC synth-2D]
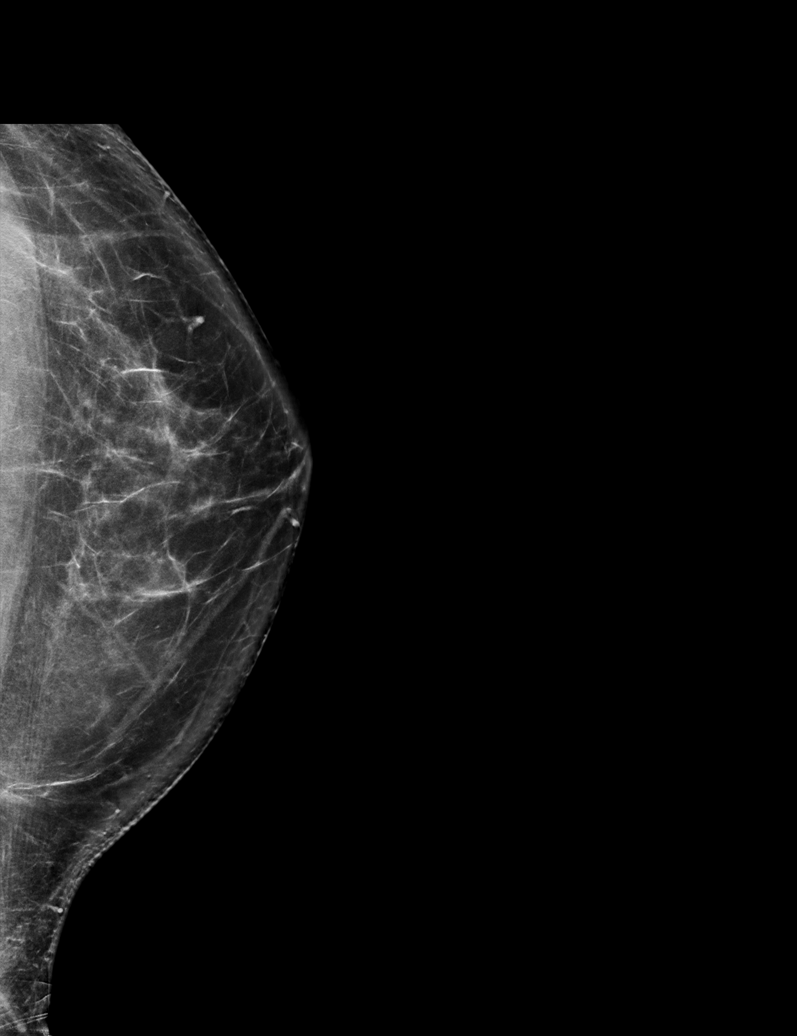

[L MLO synth-2D]
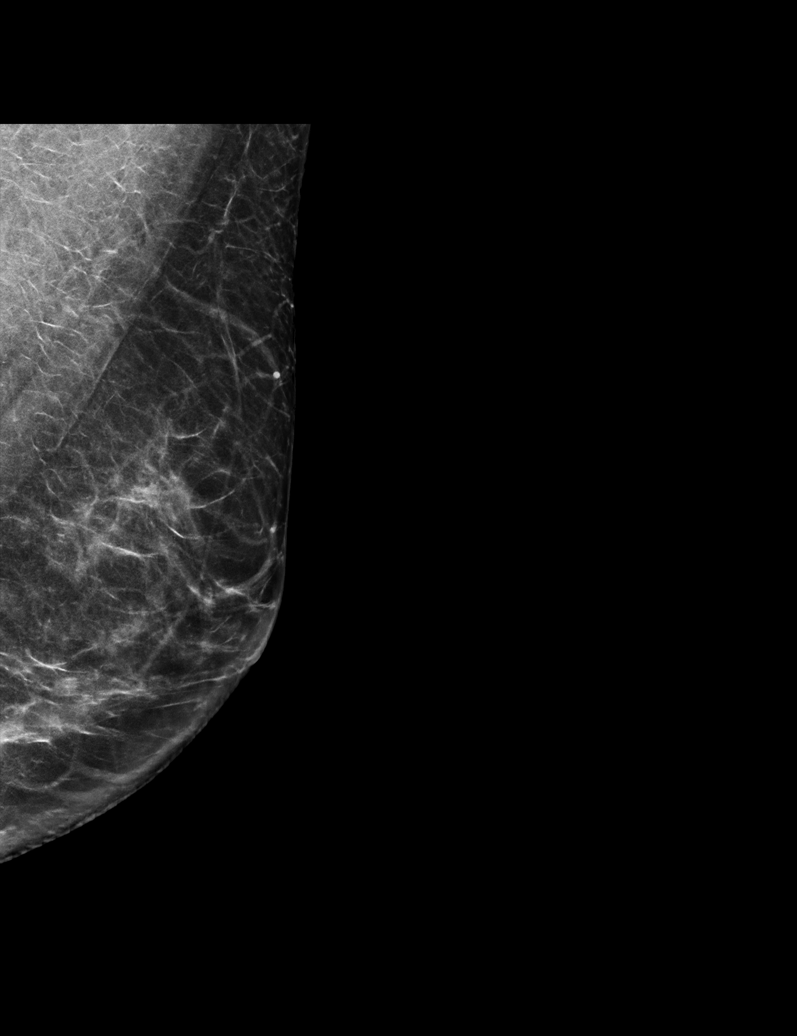

[R MLO synth-2D (2 of 2)]
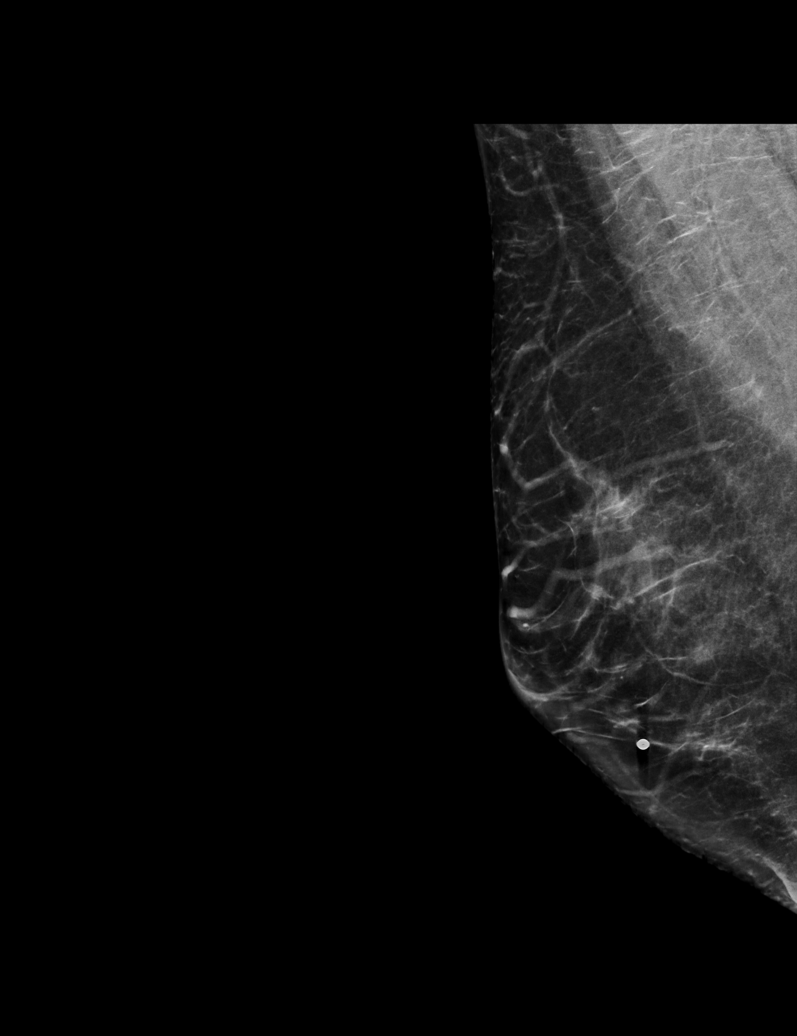

[R TAN synth-2D]
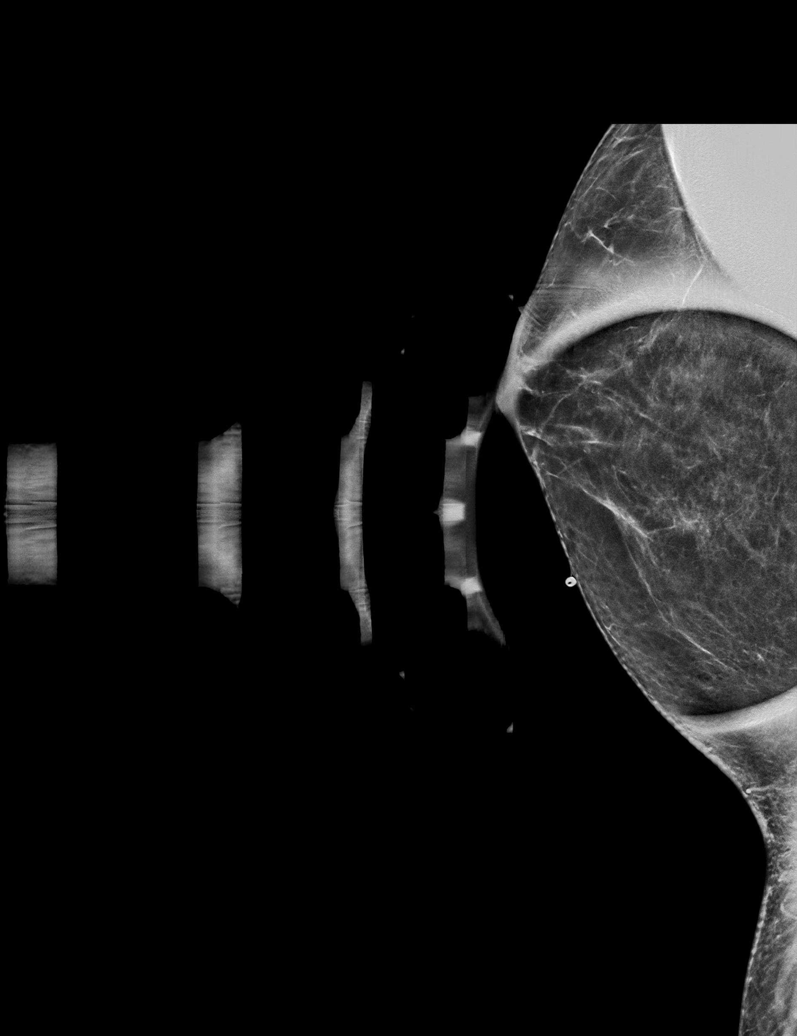

[R CC synth-2D]
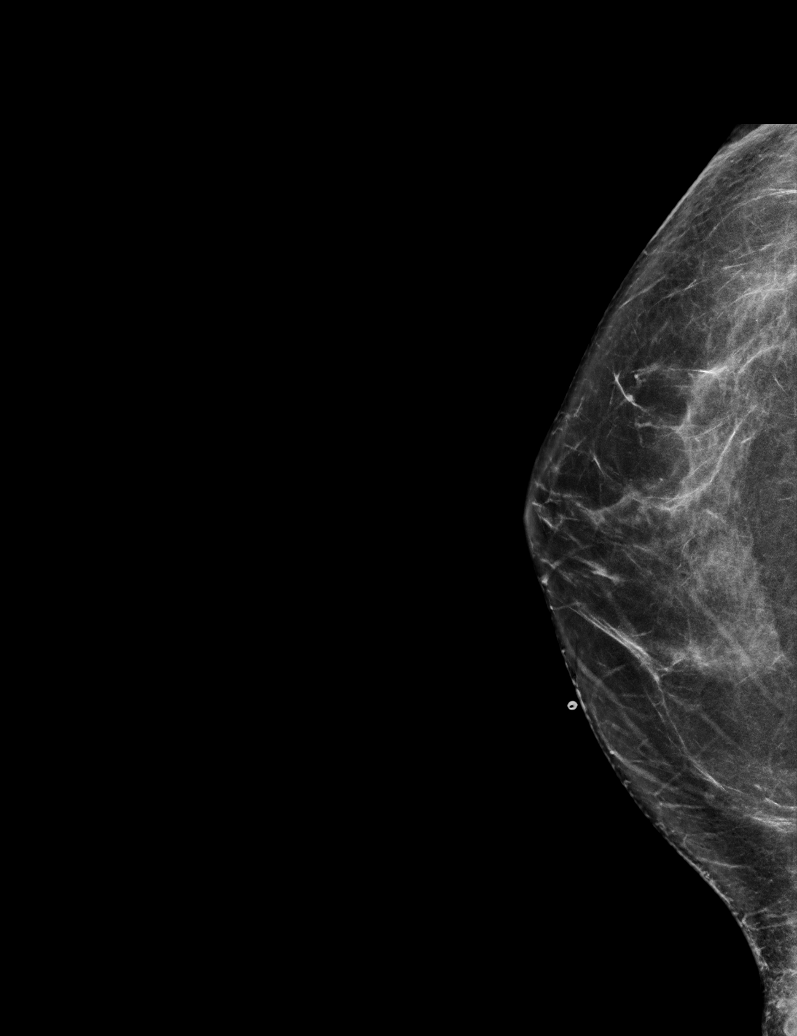

[6 of 36 positions shown; findings below may reference images not displayed]

ACR Breast Density Category b: There are scattered areas of
fibroglandular density.
FINDINGS: No suspicious masses, calcifications, or distortion are identified
in either breast.

Targeted ultrasound is performed, showing no sonographic
abnormalities.
IMPRESSION: No mammographic or sonographic evidence of malignancy.

RECOMMENDATION:
Treatment of the patient's symptoms should be based on clinical and
physical exam given lack of imaging findings. Recommend annual
screening mammography.

I have discussed the findings and recommendations with the patient.
If applicable, a reminder letter will be sent to the patient
regarding the next appointment.

BI-RADS CATEGORY  1: Negative.

## 2022-12-01 IMAGING — US US BREAST*R* LIMITED INC AXILLA
1 series · 4 of 4 positions shown · non-contrast
Comparison: None.

CLINICAL DATA: The patient presents with a palpable lump in the
right breast.

EXAM:
DIGITAL DIAGNOSTIC BILATERAL MAMMOGRAM WITH TOMOSYNTHESIS AND CAD;
ULTRASOUND RIGHT BREAST LIMITED
TECHNIQUE: Bilateral digital diagnostic mammography and breast tomosynthesis
was performed. The images were evaluated with computer-aided
detection.; Targeted ultrasound examination of the right breast was
performed

[Series 1: us breast*right* limited inc axilla · 0.06mm/px · 4 of 4 slices shown]
[im 1/4]
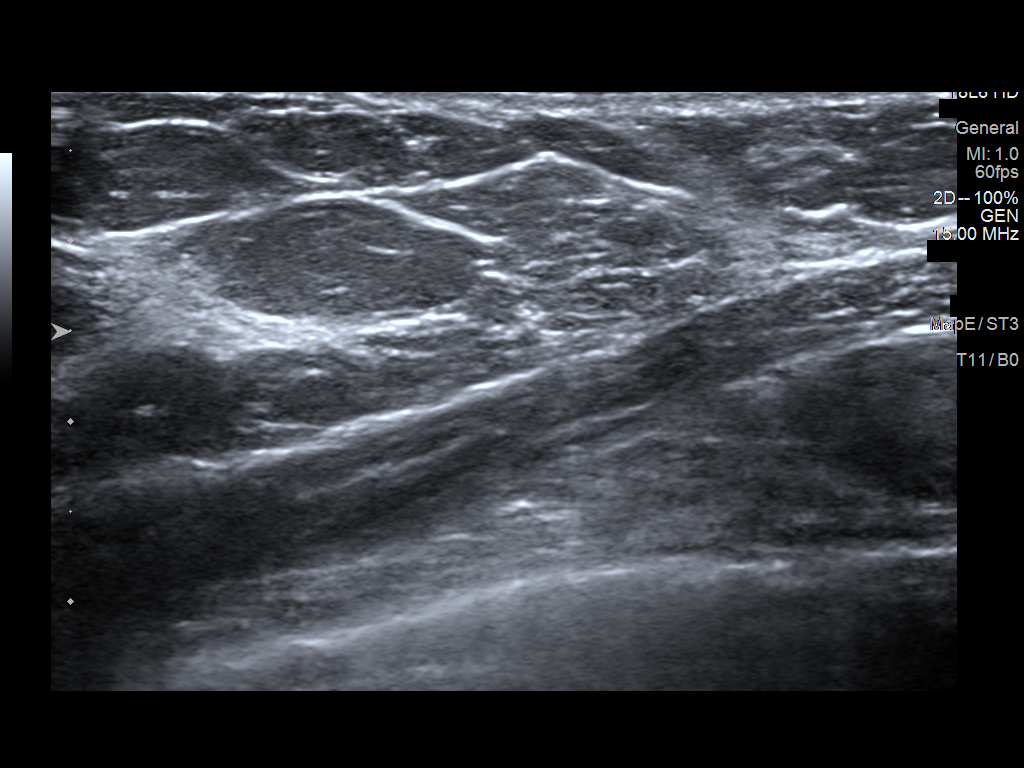
[im 2/4]
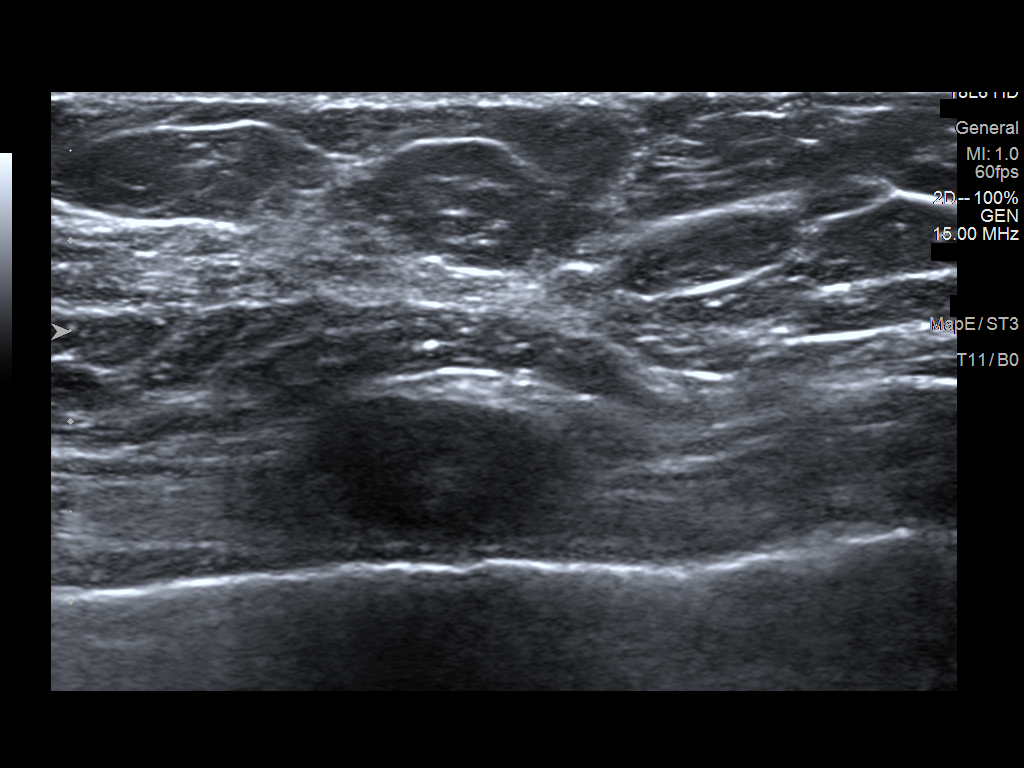
[im 3/4]
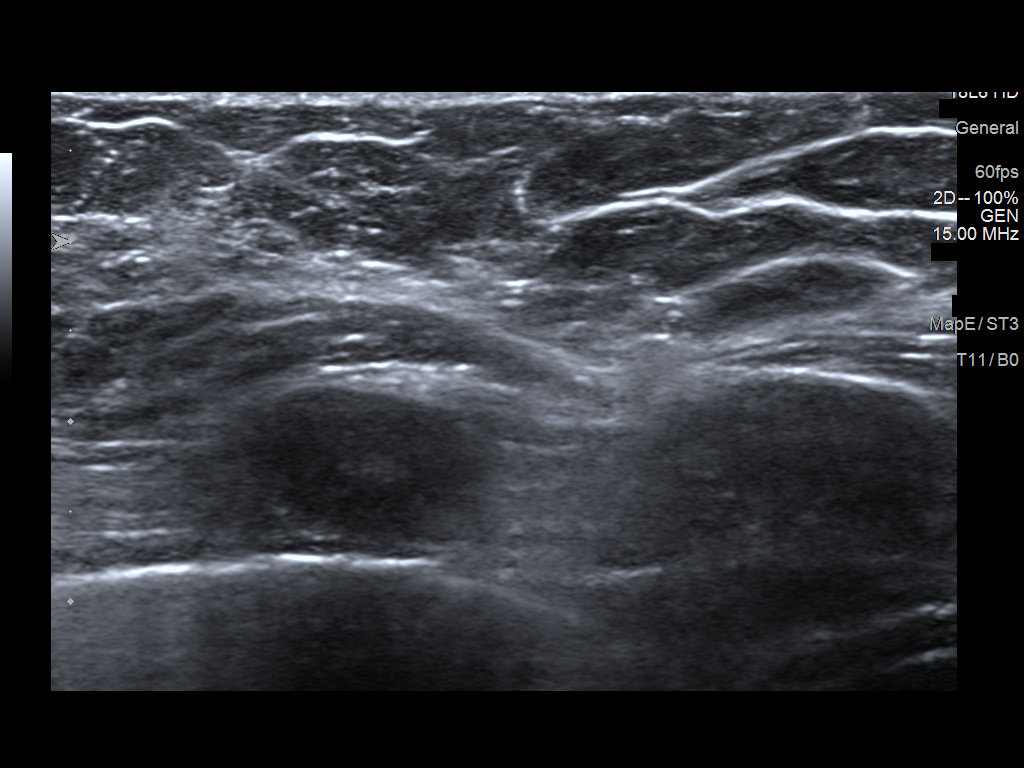
[im 4/4]
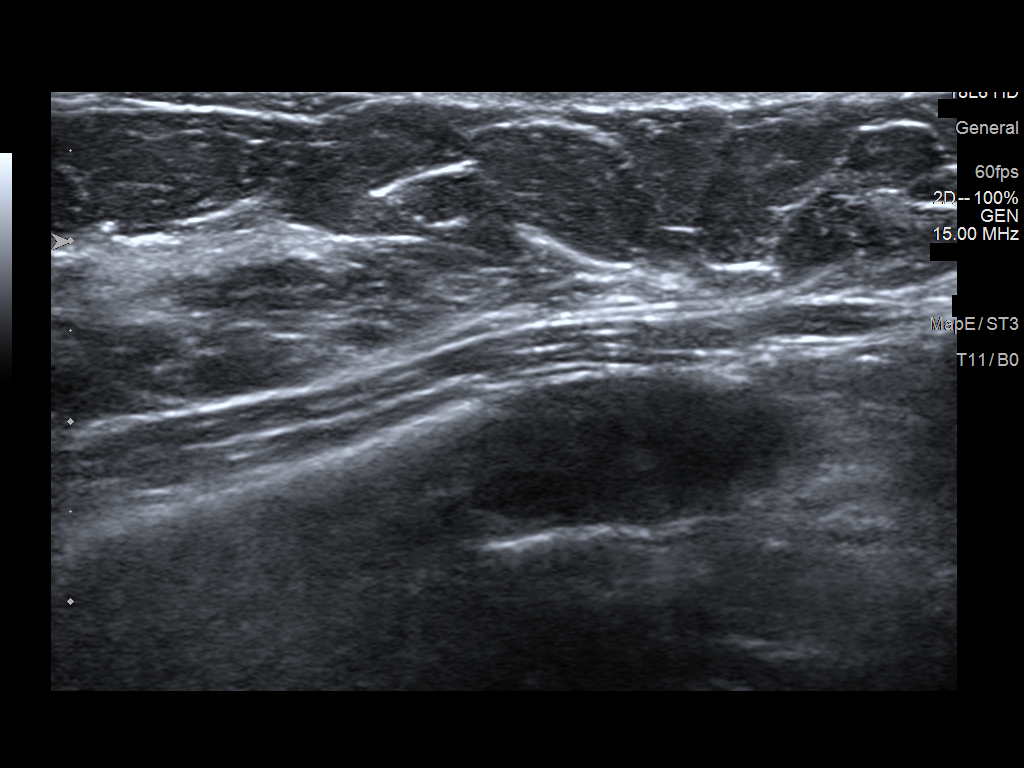

[4 of 4 positions shown; findings below may reference images not displayed]

ACR Breast Density Category b: There are scattered areas of
fibroglandular density.
FINDINGS: No suspicious masses, calcifications, or distortion are identified
in either breast.

Targeted ultrasound is performed, showing no sonographic
abnormalities.
IMPRESSION: No mammographic or sonographic evidence of malignancy.

RECOMMENDATION:
Treatment of the patient's symptoms should be based on clinical and
physical exam given lack of imaging findings. Recommend annual
screening mammography.

I have discussed the findings and recommendations with the patient.
If applicable, a reminder letter will be sent to the patient
regarding the next appointment.

BI-RADS CATEGORY  1: Negative.

## 2023-01-01 ENCOUNTER — Ambulatory Visit: Payer: BC Managed Care – PPO | Admitting: Family Medicine

## 2023-01-01 VITALS — BP 111/69 | HR 63 | Ht 65.5 in | Wt 151.0 lb

## 2023-01-01 DIAGNOSIS — L659 Nonscarring hair loss, unspecified: Secondary | ICD-10-CM | POA: Diagnosis not present

## 2023-01-01 DIAGNOSIS — F418 Other specified anxiety disorders: Secondary | ICD-10-CM

## 2023-01-01 MED ORDER — FLUOXETINE HCL 20 MG PO CAPS: 20.0000 mg | ORAL_CAPSULE | Freq: Every day | ORAL | 3 refills | Status: DC

## 2023-01-01 NOTE — Assessment & Plan Note (Signed)
Fluoxetine has worked well for her.  Doesn't think that she needs the 40mg  strength.  Reducing to 20mg  and will follow up in 6 months.

## 2023-01-01 NOTE — Patient Instructions (Signed)
Reduce fluoxetine to 20mg  daily See me again in 6 months or sooner if needed.

## 2023-01-01 NOTE — Progress Notes (Signed)
Samantha Ochoa - 42 y.o. female MRN [id number]  Date of birth: 05-Feb-1981  Subjective Chief Complaint  Patient presents with   Mood    HPI Samantha Ochoa is a 42 y.o. female here today for follow up visit.   She reports that she is doing quite well. She has been seeing [facility] through [facility].  Her weight is down about 20lbs since last visit.  She is taking topiramate.  She has noted some hair loss over the past few weeks.  She continues on fluoxetine as well.  She reports that mood is well controlled. Wants to try backing down on fluoxetine, not sure if she needs current strength.   ROS:  A comprehensive ROS was completed and negative except as noted per HPI  Allergies  Allergen Reactions   Azithromycin Hives and Rash   Erythromycin Rash   Buspirone Hcl Other (See Comments)    'felt weird'    Past Medical History:  Diagnosis Date   Anxiety    Asthma     Past Surgical History:  Procedure Laterality Date   BLADDER SURGERY  1991    Social History   Socioeconomic History   Marital status: Married    Spouse name: Not on file   Number of children: Not on file   Years of education: Not on file   Highest education level: Bachelor's degree (e.g., BA, AB, BS)  Occupational History   Not on file  Tobacco Use   Smoking status: Never   Smokeless tobacco: Never  Vaping Use   Vaping Use: Never used  Substance and Sexual Activity   Alcohol use: Yes    Alcohol/week: 1.0 standard drink of alcohol    Types: 1 Standard drinks or equivalent per week   Drug use: Never   Sexual activity: Yes    Birth control/protection: I.U.D.  Other Topics Concern   Not on file  Social History Narrative   Not on file   Social Determinants of Health   Financial Resource Strain: Low Risk  (01/01/2023)   Overall Financial Resource Strain (CARDIA)    Difficulty of Paying Living Expenses: Not hard at all  Food Insecurity: No Food Insecurity (01/01/2023)   Hunger Vital Sign    Worried  About Running Out of Food in the Last Year: Never true    Ran Out of Food in the Last Year: Never true  Transportation Needs: No Transportation Needs (01/01/2023)   PRAPARE - Administrator, Civil Service (Medical): No    Lack of Transportation (Non-Medical): No  Physical Activity: Sufficiently Active (01/01/2023)   Exercise Vital Sign    Days of Exercise per Week: 2 days    Minutes of Exercise per Session: 150+ min  Stress: No Stress Concern Present (01/01/2023)   [employer] of Occupational Health - Occupational Stress Questionnaire    Feeling of Stress : Only a little  Social Connections: Moderately Integrated (01/01/2023)   Social Connection and Isolation Panel [NHANES]    Frequency of Communication with Friends and Family: Once a week    Frequency of Social Gatherings with Friends and Family: Twice a week    Attends Religious Services: 1 to 4 times per year    Active Member of [employer] or Organizations: No    Attends Engineer, structural: Not on file    Marital Status: Married    Family History  Problem Relation Age of Onset   Breast cancer Mother    Hypertension Mother  Heart disease Father    Seizures Sister     Health Maintenance  Topic Date Due   COVID-19 Vaccine (4 - 2023-24 season) 01/17/2023 (Originally 03/21/2022)   PAP SMEAR-Modifier  04/03/2023 (Originally 09/26/2020)   Hepatitis C Screening  05/22/2023 (Originally 12/29/1999)   HIV Screening  05/22/2023 (Originally 12/28/1996)   INFLUENZA VACCINE  02/19/2023   DTaP/Tdap/Td (5 - Td or Tdap) 04/07/2027   HPV VACCINES  Aged Out     ----------------------------------------------------------------------------------------------------------------------------------------------------------------------------------------------------------------- Physical Exam BP 111/69 (BP Location: Left Arm, Patient Position: Sitting, Cuff Size: Normal)   Pulse 63   Ht 5' 5.5" (1.664 m)   Wt 151 lb (68.5 kg)    SpO2 98%   BMI 24.75 kg/m   Physical Exam Constitutional:      Appearance: Normal appearance.  Eyes:     General: No scleral icterus. Neurological:     Mental Status: She is alert.  Psychiatric:        Mood and Affect: Mood normal.        Behavior: Behavior normal.     ------------------------------------------------------------------------------------------------------------------------------------------------------------------------------------------------------------------- Assessment and Plan  Hair loss Discussed that this may be related to topiramate.  Checking TSH, ferritin and zinc levels.   Anxiety with depression Fluoxetine has worked well for her.  Doesn't think that she needs the 40mg  strength.  Reducing to 20mg  and will follow up in 6 months.    Meds ordered this encounter  Medications   FLUoxetine (PROZAC) 20 MG capsule    Sig: Take 1 capsule (20 mg total) by mouth daily.    Dispense:  90 capsule    Refill:  3    No follow-ups on file.    This visit occurred during the SARS-CoV-2 public health emergency.  Safety protocols were in place, including screening questions prior to the visit, additional usage of staff PPE, and extensive cleaning of exam room while observing appropriate contact time as indicated for disinfecting solutions.

## 2023-01-01 NOTE — Assessment & Plan Note (Signed)
Discussed that this may be related to topiramate.  Checking TSH, ferritin and zinc levels.

## 2023-01-05 LAB — IRON,TIBC AND FERRITIN PANEL
%SAT: 25 % (calc) (ref 16–45)
Ferritin: 44 ng/mL (ref 16–232)
Iron: 83 ug/dL (ref 40–190)
TIBC: 336 mcg/dL (calc) (ref 250–450)

## 2023-01-05 LAB — ZINC: Zinc: 72 ug/dL (ref 60–130)

## 2023-01-05 LAB — TSH: TSH: 0.86 mIU/L

## 2023-03-12 ENCOUNTER — Ambulatory Visit: Payer: BC Managed Care – PPO | Admitting: Family Medicine

## 2023-03-12 ENCOUNTER — Encounter: Payer: Self-pay | Admitting: Family Medicine

## 2023-03-12 VITALS — BP 129/83 | HR 80 | Ht 65.0 in | Wt 162.0 lb

## 2023-03-12 DIAGNOSIS — F909 Attention-deficit hyperactivity disorder, unspecified type: Secondary | ICD-10-CM | POA: Insufficient documentation

## 2023-03-12 DIAGNOSIS — F951 Chronic motor or vocal tic disorder: Secondary | ICD-10-CM | POA: Diagnosis not present

## 2023-03-12 MED ORDER — LISDEXAMFETAMINE DIMESYLATE 20 MG PO CAPS: 20.0000 mg | ORAL_CAPSULE | Freq: Every day | ORAL | 0 refills | Status: DC

## 2023-03-12 NOTE — Assessment & Plan Note (Signed)
Previous evaluation consistent with mild ADHD.  Starting Vyvanse 20 mg daily.  We discussed that sometimes stimulants can worsen motor tics and she will let me know if this occurs.  I will plan to see her back in about 4 to 6 weeks.

## 2023-03-12 NOTE — Progress Notes (Signed)
Samantha Ochoa - 41 y.o. female MRN [id number]  Date of birth: [birth year]  Subjective Chief Complaint  Patient presents with   Medical Management of Chronic Issues    ADHD test results/ medication consult  and  intermitting chest palpitations     HPI Samantha Ochoa is a 42 y.o. female here today to discuss treatment of ADD.  She had evaluation a little over a year ago with Dr. Dewaine Conger this demonstrated symptoms consistent with ADHD.  She is in a new role this year at work and has noted increased symptoms as well as some difficulty with maintaining and staying on task.  Additionally she feels that her tics may be a little worse.  She does not feel any more stress than usual but feels like there may be a [place] possibilities with her new position.  She also notes sensation of racing heart at times.  This happens most days.  No shortness of breath or chest pain with this.  She does admit that she could be a little better with fluid intake.  ROS:  A comprehensive ROS was completed and negative except as noted per HPI  Allergies  Allergen Reactions   Azithromycin Hives and Rash   Erythromycin Rash   Buspirone Hcl Other (See Comments)    'felt weird'    Past Medical History:  Diagnosis Date   Anxiety    Asthma     Past Surgical History:  Procedure Laterality Date   BLADDER SURGERY  1991    Social History   Socioeconomic History   Marital status: Married    Spouse name: Not on file   Number of children: Not on file   Years of education: Not on file   Highest education level: Bachelor's degree (e.g., BA, AB, BS)  Occupational History   Not on file  Tobacco Use   Smoking status: Never   Smokeless tobacco: Never  Vaping Use   Vaping status: Never Used  Substance and Sexual Activity   Alcohol use: Yes    Alcohol/week: 1.0 standard drink of alcohol    Types: 1 Standard drinks or equivalent per week   Drug use: Never   Sexual activity: Yes    Birth  control/protection: I.U.D.  Other Topics Concern   Not on file  Social History Narrative   Not on file   Social Determinants of Health   Financial Resource Strain: Low Risk  (01/01/2023)   Overall Financial Resource Strain (CARDIA)    Difficulty of Paying Living Expenses: Not hard at all  Food Insecurity: No Food Insecurity (01/01/2023)   Hunger Vital Sign    Worried About Running Out of Food in the Last Year: Never true    Ran Out of Food in the Last Year: Never true  Transportation Needs: No Transportation Needs (01/01/2023)   PRAPARE - Administrator, Civil Service (Medical): No    Lack of Transportation (Non-Medical): No  Physical Activity: Sufficiently Active (01/01/2023)   Exercise Vital Sign    Days of Exercise per Week: 2 days    Minutes of Exercise per Session: 150+ min  Stress: No Stress Concern Present (01/01/2023)   [employer] of Occupational Health - Occupational Stress Questionnaire    Feeling of Stress : Only a little  Social Connections: Moderately Integrated (01/01/2023)   Social Connection and Isolation Panel [NHANES]    Frequency of Communication with Friends and Family: Once a week    Frequency of Social Gatherings with Friends and  Family: Twice a week    Attends Religious Services: 1 to 4 times per year    Active Member of Clubs or Organizations: No    Attends Engineer, structural: Not on file    Marital Status: Married    Family History  Problem Relation Age of Onset   Breast cancer Mother    Hypertension Mother    Heart disease Father    Seizures Sister     Health Maintenance  Topic Date Due   COVID-19 Vaccine (4 - 2023-24 season) 03/21/2022   PAP SMEAR-Modifier  04/03/2023 (Originally 09/26/2020)   Hepatitis C Screening  05/22/2023 (Originally 12/29/1999)   HIV Screening  05/22/2023 (Originally 12/28/1996)   INFLUENZA VACCINE  10/19/2023 (Originally 02/19/2023)   DTaP/Tdap/Td (5 - Td or Tdap) 04/07/2027   HPV VACCINES  Aged  Out     ----------------------------------------------------------------------------------------------------------------------------------------------------------------------------------------------------------------- Physical Exam BP 129/83   Pulse 80   Ht 5\' 5"  (1.651 m)   Wt 162 lb (73.5 kg)   SpO2 99%   BMI 26.96 kg/m   Physical Exam Constitutional:      Appearance: Normal appearance.  HENT:     Head: Normocephalic and atraumatic.  Eyes:     General: No scleral icterus. Cardiovascular:     Rate and Rhythm: Normal rate.  Pulmonary:     Effort: Pulmonary effort is normal.     Breath sounds: Normal breath sounds.  Musculoskeletal:     Cervical back: Neck supple.  Neurological:     Mental Status: She is alert.  Psychiatric:        Mood and Affect: Mood normal.        Behavior: Behavior normal.     ------------------------------------------------------------------------------------------------------------------------------------------------------------------------------------------------------------------- Assessment and Plan  Chronic motor tic She has noticed some worsening of this recently.  Question if stress related due to adjustment to her new position.  ADHD Previous evaluation consistent with mild ADHD.  Starting Vyvanse 20 mg daily.  We discussed that sometimes stimulants can worsen motor tics and she will let me know if this occurs.  I will plan to see her back in about 4 to 6 weeks.   Meds ordered this encounter  Medications   lisdexamfetamine (VYVANSE) 20 MG capsule    Sig: Take 1 capsule (20 mg total) by mouth daily.    Dispense:  30 capsule    Refill:  0    Return in about 6 weeks (around 04/23/2023) for F/u ADD.    This visit occurred during the SARS-CoV-2 public health emergency.  Safety protocols were in place, including screening questions prior to the visit, additional usage of staff PPE, and extensive cleaning of exam room while observing  appropriate contact time as indicated for disinfecting solutions.

## 2023-03-12 NOTE — Assessment & Plan Note (Signed)
She has noticed some worsening of this recently.  Question if stress related due to adjustment to her new position.

## 2023-03-16 ENCOUNTER — Telehealth: Payer: Self-pay | Admitting: Family Medicine

## 2023-03-16 NOTE — Telephone Encounter (Signed)
Patient needs prior authorization for ;  lisdexamfetamine (VYVANSE) 20 MG capsule [[id number]]

## 2023-03-19 NOTE — Telephone Encounter (Signed)
Pt called.  She is following up on PA on Vyvanse.

## 2023-03-25 ENCOUNTER — Telehealth: Payer: Self-pay | Admitting: Family Medicine

## 2023-03-25 NOTE — Telephone Encounter (Signed)
Patient called in stating that pharmacy needs a PA for lisdexamfetamine (VYVANSE) 20 MG capsule. Pls advise

## 2023-03-27 ENCOUNTER — Telehealth: Payer: Self-pay

## 2023-03-27 NOTE — Telephone Encounter (Addendum)
Initiated Prior authorization VZD:[id number] Dimesylate 20MG  capsules Via: Covermymeds Case/Key:[id number] Status: approved  as of 03/27/23 Reason:Authorization Expiration Date: 03/25/2026  Notified Pt via: Mychart

## 2023-04-03 MED ORDER — LISDEXAMFETAMINE DIMESYLATE 20 MG PO CAPS: 20.0000 mg | ORAL_CAPSULE | Freq: Every day | ORAL | 0 refills | Status: DC

## 2023-04-03 NOTE — Addendum Note (Signed)
Addended by: Mammie Lorenzo on: 04/03/2023 11:50 AM   Modules accepted: Orders

## 2023-04-03 NOTE — Telephone Encounter (Signed)
Attempted to start PA for Vyvanse 20mg  tab. Refill not due until 04/12/23  Information regarding your request [facility] has indicated that it is too soon to refill this medication at the pharmacy for your patient. If you need to renew an existing PA for your patient's medication, please reach out to [facility] directly at [phone]

## 2023-04-23 ENCOUNTER — Ambulatory Visit: Payer: BC Managed Care – PPO | Admitting: Family Medicine

## 2023-04-23 ENCOUNTER — Encounter: Payer: Self-pay | Admitting: Family Medicine

## 2023-04-23 VITALS — BP 118/78 | HR 59 | Ht 65.0 in | Wt 163.0 lb

## 2023-04-23 DIAGNOSIS — L03032 Cellulitis of left toe: Secondary | ICD-10-CM | POA: Diagnosis not present

## 2023-04-23 DIAGNOSIS — F909 Attention-deficit hyperactivity disorder, unspecified type: Secondary | ICD-10-CM

## 2023-04-23 DIAGNOSIS — L03039 Cellulitis of unspecified toe: Secondary | ICD-10-CM | POA: Insufficient documentation

## 2023-04-23 MED ORDER — LISDEXAMFETAMINE DIMESYLATE 30 MG PO CAPS: 30.0000 mg | ORAL_CAPSULE | Freq: Every day | ORAL | 0 refills | Status: DC

## 2023-04-23 MED ORDER — CEPHALEXIN 500 MG PO CAPS: 500.0000 mg | ORAL_CAPSULE | Freq: Four times a day (QID) | ORAL | 0 refills | Status: AC

## 2023-04-23 NOTE — Assessment & Plan Note (Addendum)
Treating with course of cephalexin.  She will let me know if worsening.

## 2023-04-23 NOTE — Assessment & Plan Note (Signed)
Does not feel like current strength of Vyvanse is as effective as it was when she initially started.  Increase to 30 mg daily.

## 2023-04-23 NOTE — Progress Notes (Signed)
Samantha Ochoa - 42 y.o. female MRN [id number]  Date of birth: 10/27/1980  Subjective Chief Complaint  Patient presents with   Blister   ADD    HPI Samantha Ochoa is a 42 year old female here today for follow-up visit.  Recently ran a 51 mile ultramarathon.  She forgot to trim her toenails back before running and had swelling at the end of the toe and along the nailbed after running.  She decided to pop blister at the end of the toe with the needle she thought she had sterilized at home.  She is now having some erythema around the left toe with warmth and pain.  This does not seem to be spreading.  She denies drainage around the tube.  So far she is tolerating Vyvanse well at current strength.  She felt like it worked really well for the first week or 2 and then plateaued, does not seem quite as effective at this point.  ROS:  A comprehensive ROS was completed and negative except as noted per HPI  Allergies  Allergen Reactions   Azithromycin Hives and Rash   Erythromycin Rash   Buspirone Hcl Other (See Comments)    'felt weird'    Past Medical History:  Diagnosis Date   Anxiety    Asthma     Past Surgical History:  Procedure Laterality Date   BLADDER SURGERY  1991    Social History   Socioeconomic History   Marital status: Married    Spouse name: Not on file   Number of children: Not on file   Years of education: Not on file   Highest education level: Bachelor's degree (e.g., BA, AB, BS)  Occupational History   Not on file  Tobacco Use   Smoking status: Never   Smokeless tobacco: Never  Vaping Use   Vaping status: Never Used  Substance and Sexual Activity   Alcohol use: Yes    Alcohol/week: 1.0 standard drink of alcohol    Types: 1 Standard drinks or equivalent per week   Drug use: Never   Sexual activity: Yes    Birth control/protection: I.U.D.  Other Topics Concern   Not on file  Social History Narrative   Not on file   Social Determinants of  Health   Financial Resource Strain: Low Risk  (01/01/2023)   Overall Financial Resource Strain (CARDIA)    Difficulty of Paying Living Expenses: Not hard at all  Food Insecurity: No Food Insecurity (01/01/2023)   Hunger Vital Sign    Worried About Running Out of Food in the Last Year: Never true    Ran Out of Food in the Last Year: Never true  Transportation Needs: No Transportation Needs (01/01/2023)   PRAPARE - Administrator, Civil Service (Medical): No    Lack of Transportation (Non-Medical): No  Physical Activity: Sufficiently Active (01/01/2023)   Exercise Vital Sign    Days of Exercise per Week: 2 days    Minutes of Exercise per Session: 150+ min  Stress: No Stress Concern Present (01/01/2023)   [employer] of Occupational Health - Occupational Stress Questionnaire    Feeling of Stress : Only a little  Social Connections: Moderately Integrated (01/01/2023)   Social Connection and Isolation Panel [NHANES]    Frequency of Communication with Friends and Family: Once a week    Frequency of Social Gatherings with Friends and Family: Twice a week    Attends Religious Services: 1 to 4 times per year  Active Member of Clubs or Organizations: No    Attends Banker Meetings: Not on file    Marital Status: Married    Family History  Problem Relation Age of Onset   Breast cancer Mother    Hypertension Mother    Heart disease Father    Seizures Sister     Health Maintenance  Topic Date Due   Cervical Cancer Screening (HPV/Pap Cotest)  09/26/2020   COVID-19 Vaccine (4 - 2023-24 season) 05/09/2023 (Originally 03/22/2023)   Hepatitis C Screening  05/22/2023 (Originally 12/29/1999)   HIV Screening  05/22/2023 (Originally 12/28/1996)   INFLUENZA VACCINE  10/19/2023 (Originally 02/19/2023)   DTaP/Tdap/Td (5 - Td or Tdap) 04/07/2027   HPV VACCINES  Aged Out      ----------------------------------------------------------------------------------------------------------------------------------------------------------------------------------------------------------------- Physical Exam BP 118/78 (BP Location: Left Arm, Patient Position: Sitting, Cuff Size: Normal)   Pulse (!) 59   Ht 5\' 5"  (1.651 m)   Wt 163 lb (73.9 kg)   SpO2 100%   BMI 27.12 kg/m   Physical Exam Constitutional:      Appearance: Normal appearance.  HENT:     Head: Normocephalic and atraumatic.  Eyes:     General: No scleral icterus. Skin:    Comments: Left great toe with erythema, small blister under edge of toenail.  No fluctuance noted.  Neurological:     Mental Status: She is alert.  Psychiatric:        Mood and Affect: Mood normal.        Behavior: Behavior normal.     ------------------------------------------------------------------------------------------------------------------------------------------------------------------------------------------------------------------- Assessment and Plan  ADHD Does not feel like current strength of Vyvanse is as effective as it was when she initially started.  Increase to 30 mg daily.  Cellulitis of toe Treating with course of cephalexin.  She will let me know if worsening.   Meds ordered this encounter  Medications   cephALEXin (KEFLEX) 500 MG capsule    Sig: Take 1 capsule (500 mg total) by mouth 4 (four) times daily for 7 days.    Dispense:  28 capsule    Refill:  0   lisdexamfetamine (VYVANSE) 30 MG capsule    Sig: Take 1 capsule (30 mg total) by mouth daily.    Dispense:  30 capsule    Refill:  0    Return in about 3 months (around 07/24/2023) for ADHD.    This visit occurred during the SARS-CoV-2 public health emergency.  Safety protocols were in place, including screening questions prior to the visit, additional usage of staff PPE, and extensive cleaning of exam room while observing appropriate  contact time as indicated for disinfecting solutions.

## 2023-04-27 ENCOUNTER — Telehealth: Payer: Self-pay | Admitting: Family Medicine

## 2023-04-27 NOTE — Telephone Encounter (Signed)
Patient called in needing a PA for Vyvanse 30MG , also states that she leave to go out of town Thursday. PA request form is in patients chart.

## 2023-04-30 NOTE — Telephone Encounter (Signed)
PA sent to insurance. Allow 72 hours for response. PA due to change in medication dosage.

## 2023-05-06 ENCOUNTER — Telehealth: Payer: Self-pay | Admitting: Family Medicine

## 2023-05-06 NOTE — Telephone Encounter (Signed)
Patient is calling to follow up on PA for [facility] 30mg  she is would like a call back  [phone]  [facility] [address] [place] [address] Phone [phone]

## 2023-07-02 ENCOUNTER — Telehealth: Payer: Self-pay

## 2023-07-02 MED ORDER — LISDEXAMFETAMINE DIMESYLATE 30 MG PO CAPS: 30.0000 mg | ORAL_CAPSULE | Freq: Every day | ORAL | 0 refills | Status: DC

## 2023-07-02 NOTE — Telephone Encounter (Signed)
Copied from CRM [id number]. Topic: Clinical - Medication Refill >> Jul 01, 2023  9:24 AM Dollene Primrose wrote: Most Recent Primary Care Visit:  Provider: Everrett Coombe  Department: [facility]  Visit Type: OFFICE VISIT  Date: 04/23/2023  Medication:  lisdexamfetamine (VYVANSE) 30 MG capsule   Has the patient contacted their pharmacy? yes (Agent: If no, request that the patient contact the pharmacy for the refill. If patient does not wish to contact the pharmacy document the reason why and proceed with request.) (Agent: If yes, when and what did the pharmacy advise?)  Is this the correct pharmacy for this prescription? yes If no, delete pharmacy and type the correct one.  This is the patient's preferred pharmacy:   [id number] - [place],  - [address] AT [facility] [address] [place] [address] Phone: [phone] Fax: [phone]   Has the prescription been filled recently? yes  Is the patient out of the medication? yes  Has the patient been seen for an appointment in the last year OR does the patient have an upcoming appointment? yes  Can we respond through MyChart? yes  Agent: Please be advised that Rx refills may take up to 3 business days. We ask that you follow-up with your pharmacy.

## 2023-07-02 NOTE — Telephone Encounter (Signed)
Requesting rx rf of Vyvanse Last written 04/23/2023 Last OV 04/23/2023 Upcoming appt 07/29/2023

## 2023-07-29 ENCOUNTER — Encounter: Payer: Self-pay | Admitting: Family Medicine

## 2023-07-29 ENCOUNTER — Ambulatory Visit: Payer: Self-pay | Admitting: Family Medicine

## 2023-07-29 VITALS — BP 128/79 | HR 66 | Ht 65.0 in | Wt 176.0 lb

## 2023-07-29 DIAGNOSIS — F909 Attention-deficit hyperactivity disorder, unspecified type: Secondary | ICD-10-CM

## 2023-07-29 MED ORDER — LISDEXAMFETAMINE DIMESYLATE 30 MG PO CAPS: 30.0000 mg | ORAL_CAPSULE | Freq: Every day | ORAL | 0 refills | Status: DC

## 2023-07-29 MED ORDER — AMPHETAMINE-DEXTROAMPHETAMINE 5 MG PO TABS: ORAL_TABLET | ORAL | 0 refills | Status: DC

## 2023-07-29 NOTE — Assessment & Plan Note (Signed)
 Vyvanse is working pretty well for her.  Will add an additional Adderall 5 mg as needed in the afternoon as effects do seem to be wearing off on days while she is at work.  She will let me know if this is not effective.

## 2023-07-29 NOTE — Progress Notes (Signed)
 Samantha Ochoa - 43 y.o. female MRN [id number]  Date of birth: July 30, 1980  Subjective Chief Complaint  Patient presents with   ADHD    HPI Samantha Ochoa is a 43 year old female here today for follow-up of ADD.  She reports that Vyvanse  at current strength is working pretty well for her.  She does notice that some days at work this seems to be wearing off a little early.  When she is not at work strength seems to be okay.  She has not had any significant side effects from Vyvanse  at current strength.  ROS:  A comprehensive ROS was completed and negative except as noted per HPI  Allergies  Allergen Reactions   Azithromycin Hives and Rash   Erythromycin Rash   Buspirone  Hcl Other (See Comments)    'felt weird'    Past Medical History:  Diagnosis Date   Anxiety    Asthma     Past Surgical History:  Procedure Laterality Date   BLADDER SURGERY  1991    Social History   Socioeconomic History   Marital status: Married    Spouse name: Not on file   Number of children: Not on file   Years of education: Not on file   Highest education level: Bachelor's degree (e.g., BA, AB, BS)  Occupational History   Not on file  Tobacco Use   Smoking status: Never   Smokeless tobacco: Never  Vaping Use   Vaping status: Never Used  Substance and Sexual Activity   Alcohol use: Yes    Alcohol/week: 1.0 standard drink of alcohol    Types: 1 Standard drinks or equivalent per week   Drug use: Never   Sexual activity: Yes    Birth control/protection: I.U.D.  Other Topics Concern   Not on file  Social History Narrative   Not on file   Social Drivers of Health   Financial Resource Strain: Low Risk  (01/01/2023)   Overall Financial Resource Strain (CARDIA)    Difficulty of Paying Living Expenses: Not hard at all  Food Insecurity: No Food Insecurity (01/01/2023)   Hunger Vital Sign    Worried About Running Out of Food in the Last Year: Never true    Ran Out of Food in the Last  Year: Never true  Transportation Needs: No Transportation Needs (01/01/2023)   PRAPARE - Administrator, Civil Service (Medical): No    Lack of Transportation (Non-Medical): No  Physical Activity: Sufficiently Active (01/01/2023)   Exercise Vital Sign    Days of Exercise per Week: 2 days    Minutes of Exercise per Session: 150+ min  Stress: No Stress Concern Present (01/01/2023)   [employer] of Occupational Health - Occupational Stress Questionnaire    Feeling of Stress : Only a little  Social Connections: Moderately Integrated (01/01/2023)   Social Connection and Isolation Panel [NHANES]    Frequency of Communication with Friends and Family: Once a week    Frequency of Social Gatherings with Friends and Family: Twice a week    Attends Religious Services: 1 to 4 times per year    Active Member of [employer] or Organizations: No    Attends Engineer, Structural: Not on file    Marital Status: Married    Family History  Problem Relation Age of Onset   Breast cancer Mother    Hypertension Mother    Heart disease Father    Seizures Sister     Advertising Copywriter  Date Due   HIV Screening  Never done   Hepatitis C Screening  Never done   Cervical Cancer Screening (HPV/Pap Cotest)  09/26/2020   COVID-19 Vaccine (4 - 2024-25 season) 03/22/2023   INFLUENZA VACCINE  10/19/2023 (Originally 02/19/2023)   DTaP/Tdap/Td (5 - Td or Tdap) 04/07/2027   HPV VACCINES  Aged Out     ----------------------------------------------------------------------------------------------------------------------------------------------------------------------------------------------------------------- Physical Exam BP 128/79 (Cuff Size: Normal)   Pulse 66   Ht 5' 5 (1.651 m)   Wt 176 lb (79.8 kg)   SpO2 99%   BMI 29.29 kg/m   Physical Exam Constitutional:      Appearance: Normal appearance.  Eyes:     General: No scleral icterus. Cardiovascular:     Rate and Rhythm:  Normal rate and regular rhythm.  Pulmonary:     Effort: Pulmonary effort is normal.     Breath sounds: Normal breath sounds.  Musculoskeletal:     Cervical back: Neck supple.  Neurological:     Mental Status: She is alert.  Psychiatric:        Mood and Affect: Mood normal.        Behavior: Behavior normal.     ------------------------------------------------------------------------------------------------------------------------------------------------------------------------------------------------------------------- Assessment and Plan  ADHD Vyvanse  is working pretty well for her.  Will add an additional Adderall 5 mg as needed in the afternoon as effects do seem to be wearing off on days while she is at work.  She will let me know if this is not effective.   Meds ordered this encounter  Medications   amphetamine -dextroamphetamine  (ADDERALL) 5 MG tablet    Sig: Take 1 tab in the afternoon as needed.    Dispense:  15 tablet    Refill:  0   lisdexamfetamine (VYVANSE ) 30 MG capsule    Sig: Take 1 capsule (30 mg total) by mouth daily.    Dispense:  30 capsule    Refill:  0   lisdexamfetamine (VYVANSE ) 30 MG capsule    Sig: Take 1 capsule (30 mg total) by mouth daily.    Dispense:  30 capsule    Refill:  0   DISCONTD: lisdexamfetamine (VYVANSE ) 30 MG capsule    Sig: Take 1 capsule (30 mg total) by mouth daily.    Dispense:  30 capsule    Refill:  0   lisdexamfetamine (VYVANSE ) 30 MG capsule    Sig: Take 1 capsule (30 mg total) by mouth daily.    Dispense:  30 capsule    Refill:  0    Return in about 3 months (around 10/27/2023) for ADHD.    This visit occurred during the SARS-CoV-2 public health emergency.  Safety protocols were in place, including screening questions prior to the visit, additional usage of staff PPE, and extensive cleaning of exam room while observing appropriate contact time as indicated for disinfecting solutions.

## 2023-09-17 ENCOUNTER — Other Ambulatory Visit: Payer: Self-pay | Admitting: Family Medicine

## 2023-09-20 ENCOUNTER — Encounter: Payer: Self-pay | Admitting: Family Medicine

## 2023-09-20 MED ORDER — AMPHETAMINE-DEXTROAMPHETAMINE 5 MG PO TABS: ORAL_TABLET | ORAL | 0 refills | Status: DC

## 2023-10-05 ENCOUNTER — Telehealth: Payer: Self-pay

## 2023-10-05 DIAGNOSIS — F418 Other specified anxiety disorders: Secondary | ICD-10-CM

## 2023-10-05 NOTE — Telephone Encounter (Signed)
 Copied from CRM [id number]. Topic: Referral - Request for Referral >> Oct 05, 2023 10:59 AM Nyra Capes wrote: Did the patient discuss referral with their provider in the last year? No (If No - schedule appointment) (If Yes - send message)  Appointment offered? No  Type of order/referral and detailed reason for visit: a referral for counseling   Preference of office, provider, location: no preference  If referral order, have you been seen by this specialty before? Yes  was during COVID (If Yes, this issue or another issue? When? Where?  Can we respond through MyChart? Yes

## 2023-10-27 ENCOUNTER — Ambulatory Visit (INDEPENDENT_AMBULATORY_CARE_PROVIDER_SITE_OTHER): Payer: Self-pay | Admitting: Family Medicine

## 2023-10-27 ENCOUNTER — Encounter: Payer: Self-pay | Admitting: Family Medicine

## 2023-10-27 VITALS — BP 128/73 | HR 73 | Ht 65.0 in | Wt 177.0 lb

## 2023-10-27 DIAGNOSIS — F909 Attention-deficit hyperactivity disorder, unspecified type: Secondary | ICD-10-CM

## 2023-10-27 DIAGNOSIS — F418 Other specified anxiety disorders: Secondary | ICD-10-CM

## 2023-10-27 NOTE — Assessment & Plan Note (Signed)
 She has weaned from fluoxetine at this point.  She does report fluctuating mood.  Possibility of undiagnosed mood disorder.  Does not wish to pursue any additional testing at this time but will let me know if any symptoms change and/or  worsen.  Given information regarding behavioral urgent care if needed.

## 2023-10-27 NOTE — Progress Notes (Signed)
 Samantha Ochoa - 43 y.o. female MRN [id number]  Date of birth: 1981-04-30  Subjective Chief Complaint  Patient presents with   Anxiety   Depression    HPI Samantha Ochoa is a 43 y.o. female here today for follow-up visit.  She has pretty much discontinued all medications at this point.  She felt that she was having increased disorganized thinking with Vyvanse at current strength.  She has been off this for a few weeks.  Feels that she is doing okay at this time.  She also felt a little more jittery at current strength.  Felt it was interfering with her exercise and running at times as well.  She has been on fluoxetine for a few months now.  Does not like she needs this currently.  She does admit to mood fluctuation.  Has some days that are really good as well as some days that are not so good.  Her evaluation for ADHD did indicate possibility of mood disorder and could not rule out bipolar disorder.  This is what she wants to pursue any additional diagnostic testing at this time as things are fairly stable.  ROS:  A comprehensive ROS was completed and negative except as noted per HPI   Allergies  Allergen Reactions   Azithromycin Hives and Rash   Erythromycin Rash   Buspirone Hcl Other (See Comments)    'felt weird'    Past Medical History:  Diagnosis Date   Anxiety    Asthma     Past Surgical History:  Procedure Laterality Date   BLADDER SURGERY  1991    Social History   Socioeconomic History   Marital status: Married    Spouse name: Not on file   Number of children: Not on file   Years of education: Not on file   Highest education level: Bachelor's degree (e.g., BA, AB, BS)  Occupational History   Not on file  Tobacco Use   Smoking status: Never   Smokeless tobacco: Never  Vaping Use   Vaping status: Never Used  Substance and Sexual Activity   Alcohol use: Yes    Alcohol/week: 1.0 standard drink of alcohol    Types: 1 Standard drinks or equivalent per  week   Drug use: Never   Sexual activity: Yes    Birth control/protection: I.U.D.  Other Topics Concern   Not on file  Social History Narrative   Not on file   Social Drivers of Health   Financial Resource Strain: Low Risk  (09/26/2023)   [facility]   Overall Financial Resource Strain (CARDIA)    Difficulty of Paying Living Expenses: Not hard at all  Food Insecurity: No Food Insecurity (09/26/2023)   Received from [facility]   Hunger Vital Sign    Worried About Running Out of Food in the Last Year: Never true    Ran Out of Food in the Last Year: Never true  Transportation Needs: No Transportation Needs (09/26/2023)   Received from [facility] - Transportation    Lack of Transportation (Medical): No    Lack of Transportation (Non-Medical): No  Physical Activity: Sufficiently Active (09/26/2023)   Received from [facility]   Exercise Vital Sign    Days of Exercise per Week: 2 days    Minutes of Exercise per Session: 100 min  Stress: No Stress Concern Present (09/26/2023)   Received from [facility] of Occupational Health - Occupational Stress Questionnaire  Feeling of Stress : Only a little  Social Connections: Socially Integrated (09/26/2023)   Received from [facility]   Social Network    How would you rate your social network (family, work, friends)?: Good participation with social networks    Family History  Problem Relation Age of Onset   Breast cancer Mother    Hypertension Mother    Heart disease Father    Seizures Sister     Health Maintenance  Topic Date Due   Pneumococcal Vaccine 45-48 Years old (1 of 2 - PCV) Never done   HIV Screening  Never done   Hepatitis C Screening  Never done   Cervical Cancer Screening (HPV/Pap Cotest)  09/26/2020   COVID-19 Vaccine (4 - 2024-25 season) 03/22/2023   INFLUENZA VACCINE  02/19/2024   DTaP/Tdap/Td (5 - Td or Tdap) 04/07/2027   HPV VACCINES  Aged Out      ----------------------------------------------------------------------------------------------------------------------------------------------------------------------------------------------------------------- Physical Exam BP 128/73 (BP Location: Right Arm, Patient Position: Sitting, Cuff Size: Normal)   Pulse 73   Ht 5\' 5"  (1.651 m)   Wt 177 lb (80.3 kg)   SpO2 98%   BMI 29.45 kg/m   Physical Exam Constitutional:      Appearance: Normal appearance.  Eyes:     General: No scleral icterus. Cardiovascular:     Rate and Rhythm: Normal rate and regular rhythm.  Pulmonary:     Effort: Pulmonary effort is normal.     Breath sounds: Normal breath sounds.  Musculoskeletal:     Cervical back: Neck supple.  Neurological:     Mental Status: She is alert.  Psychiatric:        Mood and Affect: Mood normal.        Behavior: Behavior normal.     ------------------------------------------------------------------------------------------------------------------------------------------------------------------------------------------------------------------- Assessment and Plan  ADHD She has discontinued stimulant medications at this time.  She felt like she was having side effects from these.  She let me know how she is feeling over the next few weeks.  We can consider restarting a different medication or same medication at lower strength.  Anxiety with depression She has weaned from fluoxetine at this point.  She does report fluctuating mood.  Possibility of undiagnosed mood disorder.  Does not wish to pursue any additional testing at this time but will let me know if any symptoms change and/or  worsen.  Given information regarding behavioral urgent care if needed.   No orders of the defined types were placed in this encounter.   No follow-ups on file.

## 2023-10-27 NOTE — Assessment & Plan Note (Signed)
 She has discontinued stimulant medications at this time.  She felt like she was having side effects from these.  She let me know how she is feeling over the next few weeks.  We can consider restarting a different medication or same medication at lower strength.

## 2023-11-03 ENCOUNTER — Ambulatory Visit (INDEPENDENT_AMBULATORY_CARE_PROVIDER_SITE_OTHER): Payer: Self-pay | Admitting: Professional

## 2023-11-03 ENCOUNTER — Encounter: Payer: Self-pay | Admitting: Professional

## 2023-11-03 DIAGNOSIS — F900 Attention-deficit hyperactivity disorder, predominantly inattentive type: Secondary | ICD-10-CM

## 2023-11-03 DIAGNOSIS — F4323 Adjustment disorder with mixed anxiety and depressed mood: Secondary | ICD-10-CM

## 2023-11-03 NOTE — Progress Notes (Signed)
   Samantha Ochoa, [facility]

## 2023-11-03 NOTE — Progress Notes (Signed)
 [facility] Counselor Initial Adult Exam  Name: Samantha Ochoa Date: 11/03/2023 MRN: [id number] DOB: [birth year] PCP: Adela Holter, DO  Time spent: 60 minutes 301-401pm  Guardian/Payee:  self     Paperwork requested: Yes   Reason for Visit [name] Problem: This session was held via video teletherapy. The patient consented to video teletherapy and was located in her car during this session. She is aware it is the responsibility of the patient to secure confidentiality on her end of the session. The provider was in a private home office for the duration of this session.    The patient arrived on time for her Caregility appointment.  The patient reports she has been feeling completely overwhelmed and cannot manage the things she needs to get done and I am completely overwhelmed. 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  Patient reports last year she transitioned to a different position than she had last year and it is unstructured and she is having a difficult time managing  Patient lost a lot of weight through the [facility] shot so she could run. She lost thirty pounds and has gained every bit of it back and has so much weight to carry, chafing, and she is tired.  She had been on Fluoxetine but it was interfering with her ability to climax and that was her way to feel close to him since he does not hug her. She has lost both of her parents since 2020 and purchased her parent's home and has a lot of money.  Sleep has been impaired without restoration. She needs about 9 hours.  Patient has  some fleeting thoughts but reports she cannot leave her children.   Mental Status Exam: Appearance: Casual    Behavior: Sharing Motor: Normal Speech/Language: Clear and Coherent and Normal Rate Affect: Tearful Mood: sad Thought process: goal directed Thought content: WNL Sensory/Perceptual disturbances: WNL Orientation: oriented to person, place, time/date, and situation Attention: Good Concentration: Good Memory: WNL Fund of knowledge: Good Insight: Good Judgment: Good Impulse Control: Good  Risk Assessment: Danger to Self:  No Self-injurious Behavior: No Danger to Others: No Duty to Warn:no Physical Aggression / Violence:No  Access to Firearms a concern: No  Gang Involvement:No  Patient / guardian was educated about steps to take if suicide or homicide risk level increases between visits: yes While future psychiatric events cannot be accurately predicted, the patient does not currently require acute inpatient psychiatric care and does not currently meet [facility]  involuntary commitment criteria.  Substance Abuse History: Current substance abuse:   Can go months without alcohol and have a beer or two and if with friends she might drink more. She drinks to intoxication perhaps 2 times per year.  No hx of drug use.  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 Outpatient Providers: In 2020 she was in counseling through [facility] after her mother's death and post suicide attempt. The attempt was thwarted by a friend. History of Psych Hospitalization: No  Psychological Testing: none   Abuse History:  Victim of:  Yes.  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 She feels a sense of relief like hitting a wall or pushing the pain down.  Patient denies sexual abuse but as a child had a bladder  infection and went to MD frequently and they would have to hold her down and shove tubes in to her.   Report needed: No. Victim of Neglect:No. Perpetrator of none  Witness / Exposure to Domestic Violence: No   Protective Services Involvement: No  Witness to [employer] Violence:  No   Family History:  Family History  Problem Relation Age of Onset   Breast cancer Mother    Hypertension Mother    Heart disease Father    Seizures Sister    Living situation: the patient lives with their family  Sexual Orientation: Straight  Relationship Status: married 14 years after dating a year. She was married one time prior to [place]. They were together ten years and married for eight. The marriage ended due to the final straw when her husband would not come get her when her car was confiscated due to expired tags. He made 100k but would not allow her money and she had to call her parents to get something as simple as tennis shoes. Her first husband helped her stop her bulimia but would occasionally say something about people asking if she was pregnant. Name of spouse / other: Denyse Amass If a parent, number of children / ages:   Previous psychological history is significant for marital counseling with first marriage. She had expressed the first year they were married that she was not happy. Halfway through the marriage she sought a divorce and she feels certain that her father paid off the lawyer to not continue working with her.  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  Family history: Patient did not  have a good experience with food. She is a binge eater and her sister had anorexia. Patient thinks she is an Financial planner: No   Educational History: Education: post Engineer, maintenance (IT) work or degree  Religion/Sprituality/World View: Christianity but doesn't go to church regularly. She would like to get her children involved.   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 History of legal issue / charges: none  Medical History/Surgical History: reviewed Past Medical History:  Diagnosis Date   Anxiety    Asthma     Past Surgical History:  Procedure Laterality Date   BLADDER SURGERY  1991    Medications: Current Outpatient Medications  Medication Sig Dispense Refill  albuterol (VENTOLIN HFA) 108 (90 Base) MCG/ACT inhaler Inhale 1-2 puffs into the lungs every 4 (four) hours as needed for wheezing or shortness of breath. 18 g 11   amphetamine-dextroamphetamine (ADDERALL) 5 MG tablet Take 1 tab in the afternoon as needed. (Patient not taking: Reported on 10/27/2023) 15 tablet 0   Calcium Carb-Cholecalciferol 600-400 MG-UNIT TABS      fexofenadine (ALLEGRA) 60 MG tablet Take 60 mg by mouth 2 (two) times daily.     FLUoxetine (PROZAC) 20 MG capsule Take 1 capsule (20 mg total) by mouth daily. (Patient not taking: Reported on 10/27/2023) 90 capsule 3   lisdexamfetamine (VYVANSE) 30 MG capsule Take 1 capsule (30 mg total) by mouth daily. (Patient not taking: Reported on 10/27/2023) 30 capsule 0   lisdexamfetamine (VYVANSE) 30 MG capsule Take 1 capsule (30 mg total) by mouth daily. (Patient not taking: Reported on  10/27/2023) 30 capsule 0   lisdexamfetamine (VYVANSE) 30 MG capsule Take 1 capsule (30 mg total) by mouth daily. (Patient not taking: Reported on 10/27/2023) 30 capsule 0   Multiple Vitamin (MULTI-VITAMIN DAILY PO) Take by mouth.     naltrexone (DEPADE) 50 MG tablet Take 50 mg by mouth daily.     paragard intrauterine copper IUD IUD 1 each by Intrauterine route once.     No current facility-administered medications for this visit.    Allergies  Allergen Reactions   Azithromycin Hives and Rash   Erythromycin Rash   Buspirone Hcl Other (See Comments)    'felt weird'    Diagnoses:  Adjustment disorder with mixed anxiety and depressed mood  Attention deficit hyperactivity disorder (ADHD), inattentive type, mild  Plan of Care:  -patient wants to help gain a plan to manage life wihtout being so overwhelmed. -to be able to experience her life outdoors, preferably running. -pt to consider goals for treatment and come prepared to next session to discuss -next appointment will be on Tuesday, November 17, 2023 at 4pm.

## 2023-11-17 ENCOUNTER — Ambulatory Visit (INDEPENDENT_AMBULATORY_CARE_PROVIDER_SITE_OTHER): Payer: Self-pay | Admitting: Professional

## 2023-11-17 ENCOUNTER — Encounter: Payer: Self-pay | Admitting: Professional

## 2023-11-17 DIAGNOSIS — F900 Attention-deficit hyperactivity disorder, predominantly inattentive type: Secondary | ICD-10-CM

## 2023-11-17 DIAGNOSIS — F411 Generalized anxiety disorder: Secondary | ICD-10-CM

## 2023-11-17 DIAGNOSIS — F4321 Adjustment disorder with depressed mood: Secondary | ICD-10-CM | POA: Diagnosis not present

## 2023-11-17 NOTE — Progress Notes (Deleted)
 [facility] Initial Adult Exam  Name: Samantha Ochoa Date: 11/17/2023 MRN: [id number] DOB: Dec 21, 1980 PCP: Samantha Holter, DO  Time spent: 60 minutes 301-401pm  Guardian/Payee:  self     Paperwork requested: Yes   Reason for Visit Samantha Ochoa Problem: This session was held via video teletherapy. The patient consented to video teletherapy and was located in her car during this session. She is aware it is the responsibility of the patient to secure confidentiality on her end of the session. The provider was in a private home office for the duration of this session.    The patient arrived on time for her Samantha Ochoa appointment.  The patient reports she has been feeling completely overwhelmed and cannot manage the things she needs to get done and I am completely overwhelmed. 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  Patient reports last year she transitioned to a different position than she had last year and it is unstructured and she is having a difficult time managing  Patient lost a lot of weight through the Samantha Ochoa shot so she could run. She lost thirty pounds and has gained every bit of it back and has so much weight to carry, chafing, and she is tired.  She had been on Fluoxetine  but it was interfering with her ability to climax and that was her way to feel close to him since he does not hug her. She has lost both of her parents since 2020 and purchased her parent's home and has a lot of money.  Sleep has been impaired without restoration. She needs about 9 hours.  Patient  has some fleeting thoughts but reports she cannot leave her children.   Mental Status Exam: Appearance: Casual    Behavior: Sharing Motor: Normal Speech/Language: Clear and Coherent and Normal Rate Affect: Tearful Mood: sad Thought process: goal directed Thought content: WNL Sensory/Perceptual disturbances: WNL Orientation: oriented to person, place, time/date, and situation Attention: Good Concentration: Good Memory: WNL Fund of knowledge: Good Insight: Good Judgment: Good Impulse Control: Good  Risk Assessment: Danger to Self:  No Self-injurious Behavior: No Danger to Others: No Duty to Warn:no Physical Aggression / Violence:No  Access to Firearms a concern: No  Gang Involvement:No  Patient / guardian was educated about steps to take if suicide or homicide risk level increases between visits: yes While future psychiatric events cannot be accurately predicted, the patient does not currently require acute inpatient psychiatric care and does not currently meet [facility]  involuntary commitment criteria.  Substance Abuse History: Current substance abuse:   Can go months without alcohol and have a beer or two and if with friends she might drink more. She drinks to intoxication perhaps 2 times per year.  No hx of drug use.  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 [facility] Providers: In 2020 she was in counseling through [facility] after her mother's death and post suicide attempt. The attempt was thwarted by a friend. History of Psych Hospitalization: No  Psychological Testing: none   Abuse History:  Victim of: Yes.  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 She feels a sense of relief like hitting a wall or pushing the pain down.  Patient denies sexual abuse but as a child had a  bladder infection and went to MD frequently and they would have to hold her down and shove tubes in to her.   Report needed: No. Victim of Neglect:No. Perpetrator of none  Witness / Exposure to Domestic Violence: No   Protective Services Involvement: No  Witness to [employer] Violence:  No   Family History:  Family History  Problem Relation Age of Onset   Breast cancer Mother    Hypertension Mother    Heart disease Father    Seizures Sister    Living situation: the patient lives with their family  Sexual Orientation: Straight  Relationship Status: married 14 years after dating a year. She was married one time prior to Samantha Ochoa. They were together ten years and married for eight. The marriage ended due to the final straw when her husband would not come get her when her car was confiscated due to expired tags. He made 100k but would not allow her money and she had to call her parents to get something as simple as tennis shoes. Her first husband helped her stop her bulimia but would occasionally say something about people asking if she was pregnant. Name of spouse / other: Samantha Ochoa If a parent, number of children / ages:   Previous psychological history is significant for marital counseling with first marriage. She had expressed the first year they were married that she was not happy. Halfway through the marriage she sought a divorce and she feels certain that her father paid off the lawyer to not continue working with her.  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  Family history: Patient  did not have a good experience with food. She is a binge eater and her sister had anorexia. Patient thinks she is an Financial planner: No   Educational History: Education: post Engineer, maintenance (IT) work or degree  Religion/Sprituality/World View: Christianity but doesn't go to church regularly. She would like to get her children involved.   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 History of legal issue / charges: none  Medical History/Surgical History: reviewed Past Medical History:  Diagnosis Date   Anxiety    Asthma     Past Surgical History:  Procedure Laterality Date   BLADDER SURGERY  1991    Medications: Current [facility] Medications  Medication Sig Dispense Refill  albuterol  (VENTOLIN  HFA) 108 (90 Base) MCG/ACT inhaler Inhale 1-2 puffs into the lungs every 4 (four) hours as needed for wheezing or shortness of breath. 18 g 11   amphetamine -dextroamphetamine  (ADDERALL) 5 MG tablet Take 1 tab in the afternoon as needed. (Patient not taking: Reported on 10/27/2023) 15 tablet 0   Calcium Carb-Cholecalciferol 600-400 MG-UNIT TABS      fexofenadine (ALLEGRA) 60 MG tablet Take 60 mg by mouth 2 (two) times daily.     FLUoxetine  (PROZAC ) 20 MG capsule Take 1 capsule (20 mg total) by mouth daily. (Patient not taking: Reported on 10/27/2023) 90 capsule 3   lisdexamfetamine (VYVANSE ) 30 MG capsule Take 1 capsule (30 mg total) by mouth daily. (Patient not taking: Reported on 10/27/2023) 30 capsule 0   lisdexamfetamine (VYVANSE ) 30 MG capsule Take 1 capsule (30 mg total) by mouth daily. (Patient not taking: Reported on  10/27/2023) 30 capsule 0   lisdexamfetamine (VYVANSE ) 30 MG capsule Take 1 capsule (30 mg total) by mouth daily. (Patient not taking: Reported on 10/27/2023) 30 capsule 0   Multiple Vitamin (MULTI-VITAMIN DAILY PO) Take by mouth.     naltrexone (DEPADE) 50 MG tablet Take 50 mg by mouth daily.     paragard intrauterine copper IUD IUD 1 each by Intrauterine route once.     No current facility-administered medications for this visit.    Allergies  Allergen Reactions   Azithromycin Hives and Rash   Erythromycin Rash   Buspirone  Hcl Other (See Comments)    'felt weird'    Diagnoses:  No diagnosis found.  Plan of Care:  -patient wants to help gain a plan to manage life wihtout being so overwhelmed. -to be able to experience her life outdoors, preferably running. -pt to consider goals for treatment and come prepared to next session to discuss -next appointment will be on Tuesday, November 17, 2023 at 4pm.

## 2023-11-17 NOTE — Progress Notes (Signed)
 [facility] Counselor/Therapist Progress Note  Patient ID: Samantha Ochoa, MRN: [id number],    Date: 11/17/2023  Time Spent: 58 minutes [id number]   Treatment Type: Individual Therapy  Risk Assessment: Danger to Self:  No Self-injurious Behavior: No Danger to Others: No  Subjective: This session was held via video teletherapy. The patient consented to video teletherapy and was located in her car during this session. She is aware it is the responsibility of the patient to secure confidentiality on her end of the session. The provider was in a private home office for the duration of this session.    The patient arrived on time for her Caregility appointment.  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 Motor tension (e.g., restlessness, tiredness, shakiness, muscle tension). Excessive and/or unrealistic worry that is difficult to control occurring more days than  not for at least 6 months about a number of events or activities. Undue influence of body weight or shape on self-evaluation. Recurrent episodes of binge eating (a large amount of food is consumed in a relatively short period of time and there is a sense of lack of control over the eating behavior). Feeling disgusted with oneself, depressed, or very guilty after eating too much. Makes self-disparaging remarks; sees self as unattractive, worthless, a loser, a burden, unimportant; takes blame easily. Fear of rejection by others, especially peer group. Inability to identify positive characteristics of self. Feelings of depression and anxiety related to complaints of job dissatisfaction or the stress of employment responsibilities. Goals: Stabilize anxiety level while increasing ability to function on a daily basis. Resolve the core conflict that is the source of anxiety. Enhance ability to effectively cope with the full variety of life's worries and anxieties. Develop coping strategies (e.g., feeling identification, problem-solving, assertiveness) to address emotional issues that could lead to relapse of the eating disorder. Develop healthy cognitive patterns and beliefs about self that lead to positive identity and prevent a relapse of the eating disorder. Terminate overeating and implement lifestyle changes that lead to weight loss and improved health. Restore normal eating patterns, healthy weight maintenance, and a realistic appraisal of body size. Develop a consistent, positive self-image. Demonstrate improved self-esteem through more pride in appearance, more assertiveness, greater eye contact, and identification of positive traits in self-talk messages. Establish an inward sense of self-worth, confidence, and competence. Increase job satisfaction and performance due to implementation of assertiveness and stress management strategies. Objectives target date for all  objectives is  11/16/2024: Describe situations, thoughts, feelings, and actions associated with anxieties and worries, their impact on functioning, and attempts to resolve them. Learn and implement a strategy to limit the association between various environmental settings and worry, delaying the worry until a designated "worry time." Learn and implement calming skills to reduce overall anxiety and manage anxiety symptoms. Verbalize an understanding of the cognitive, physiological, and behavioral components of anxiety and its treatment. Verbalize an understanding of the role that cognitive biases play in excessive irrational worry and persistent anxiety symptoms. Identify, challenge, and replace biased, fearful self-talk with positive, realistic, and empowering self-talk. Learn and implement problem-solving strategies for realistically addressing worries. Identify and engage in pleasant activities on a daily basis. Honestly describe the pattern of eating including types, amounts, and frequency of food consumed or hoarded. Implement relapse prevention strategies for managing possible future anxiety symptoms. Verbalize an understanding of relapse prevention and the distinction between a lapse and a relapse. State a basis for positive identity that is not based on weight and appearance but on character, traits, relationships, and intrinsic value. Identify and develop a list of high-risk situations for unhealthy eating or weight loss practices. Increase insight into the historical and current sources of low self-esteem. Decrease the frequency of negative self-descriptive statements and increase frequency of positive self-descriptive statements. Identify and replace negative self-talk messages used to reinforce low self-esteem. Take verbal responsibility for accomplishments without discounting. Increase the frequency of assertive behaviors. Articulate a plan to be proactive in trying to get identified needs  met. Demonstrate an increased ability to identify and express personal feelings. Identify positive traits and talents about self. Learn and implement problem-solving skills. Implement assertiveness skills. Identify and replace distorted cognitive messages associated with feelings of job stress. Learn and implement calming skills to reduce overall anxiety and manage anxiety symptoms. Interventions: Assess the historical course of the disorder including the amount, type, and pattern of the client's food intake (e.g., too little food, too much food, binge eating, or hoarding food); perceived personal and interpersonal triggers and personal goals. Assess the nature of any external cues (e.g., persons, objects, and situations) and internal cues (thoughts, images, and impulses) that precipitate the client's uncontrolled eating and/or compensatory weight management behaviors. Direct and assist the client in construction of a hierarchy of high-risk internal and external triggers for uncontrolled eating and/or compensatory weight management behaviors. Assist the client in identifying a basis for self-worth apart from body image by reviewing his/her talents, successes, positive traits, importance to others, and intrinsic spiritual value. Discuss with the client the distinction between a lapse and relapse, associating a lapse with an initial and reversible return of distress, urges, or to avoid, and relapse with the decision to return to the cycle of maladaptive thoughts and actions (e.g., feeling anxious, binging, then purging). Identify with the client future situations or circumstances in which lapses could occur. Instruct the client to routinely use strategies learned in therapy (e.g., continued exposure to previous external or internal cues that arise) to prevent relapse. Assign the client the exercise of identifying his/her positive physical characteristics in a mirror to help him/her become more  comfortable with himself/herself. Ask the client to keep building a list of positive traits and have him/her read the list at the beginning and end of each session (or assign "Acknowledging My Strengths" or "What Are My Good Qualities?" in the Adult Psychotherapy Homework Planner by [facility]); reinforce the client's positive self-descriptive statements. Assist the client in identifying and labeling  emotions. Assist the client in identifying and verbalizing his/her needs, met and unmet. Train the client in assertiveness or refer him/her to a group that will educate and facilitate assertiveness skills via lectures and assignments. Ask the client to list accomplishments; process the integration of these into his/her self-image. Help the client become aware of his/her fear of rejection and its connection with past rejection or abandonment experiences; begin to contrast past experiences of pain with present experiences of acceptance and competence. Assist the client in becoming aware of how he/she expresses or acts out negative feelings about himself/herself. Help the client reframe his/her negative assessment of himself/herself. Assist the client in developing positive self-talk as a way of boosting his/her confidence and self-image (or assign "Positive Self-Talk" in the Adult Psychotherapy Homework Planner by Beacher Bottoms). Help the client identify his/her distorted, negative beliefs about self and the world and replace these messages with more realistic, affirmative messages (or assign "Journal and Replace Self-Defeating Thoughts" in the Adult Psychotherapy Homework Planner by [facility] or read What to Say When You Talk to Yourself by Helmstetter). Ask the client to describe his/her past experiences of anxiety and their impact on functioning; assess the focus, excessiveness, and uncontrollability of the worry and the type, frequency, intensity, and duration of his/her anxiety symptoms (consider using a structured  interview such as The Anxiety Disorders Interview Schedule-Adult Version). Explore the client's schema and self-talk that mediate his/her fear response; assist him/her in challenging the biases; replace the distorted messages with reality-based alternatives and positive, realistic self-talk that will increase his/her self-confidence in coping with irrational fears (see Cognitive Therapy of Anxiety Disorders by Anderson Kaufman). Teach the client problem-solving strategies involving specifically defining a problem, generating options for addressing it, evaluating the pros and cons of each option, selecting and implementing an optional action, and reevaluating and refining the action (or assign "Applying Problem-Solving to Interpersonal Conflict" in the Adult Psychotherapy Homework Planner by Beacher Bottoms). Engage the client in behavioral activation, increasing the client's contact with sources of reward, identifying processes that inhibit activation, and teaching skills to solve life problems (or assign "Identify and Schedule Pleasant Activities" in the Adult Psychotherapy Homework Planner by Beacher Bottoms); use behavioral techniques such as instruction, rehearsal, role-playing, role reversal as needed to assist adoption into the client's daily life; reinforce success. Discuss how generalized anxiety typically involves excessive worry about unrealistic threats, various bodily expressions of tension, overarousal, and hypervigilance, and avoidance of what is threatening that interact to maintain the problem (see Mastery of Your Anxiety and Worry: Therapist Guide by Jame Maze, and Barlow; Treating Generalized Anxiety Disorder by Rygh and Joya Nissen). Teach the client calming/relaxation skills (e.g., applied relaxation, progressive muscle relaxation, cue controlled relaxation; mindful breathing; biofeedback) and how to discriminate better between relaxation and tension; teach the client how to apply these skills to his/her  daily life (e.g., New Directions in Progressive Muscle Relaxation by Fara Hone, and Hazlett-Stevens; Treating Generalized Anxiety Disorder by Rygh and Joya Nissen). Assign the client to read about progressive muscle relaxation and other calming strategies in relevant books or treatment manuals (e.g., Progressive Relaxation Training by Rodolfo Clan and Arvil Birks; Mastery of Your Anxiety and Worry: Workbook by Rodney Clamp). Explain the rationale for using a worry time as well as how it is to be used; agree upon and implement a worry time with the client. Teach the client how to recognize, stop, and postpone worry to the agreed upon worry time using skills such as thought stopping, relaxation, and redirecting attention (or assign "  Making Use of the Thought-Stopping Technique" and/or "Worry Time" in the Adult Psychotherapy Homework Planner by Jongsma to assist skill development); encourage use in daily life; review and reinforce success while providing corrective feedback toward improvement. Discuss examples demonstrating that unrealistic worry typically overestimates the probability of threats and underestimates or overlooks the client's ability to manage realistic demands (or assign "Past Successful Anxiety Coping" in the Adult Psychotherapy Homework Planner by [facility]). Conduct Problem-Solving Therapy (see Problem-Solving Therapy by Donnice Gale and Nezu) using techniques such as psychoeducation, modeling, and role-playing to teach the client problem-solving skills (i.e., defining a problem specifically, generating possible solutions, evaluating the pros and cons of each solution, selecting and implementing a plan of action, evaluating the efficacy of the plan, accepting or revising the plan); role-play application of the problem-solving skill to a real life issue (or assign "Applying Problem-Solving to Interpersonal Conflict" in the Adult Psychotherapy Homework Planner by Beacher Bottoms). Probe and clarify the  client's emotions surrounding his/her vocational stress. Assess the client's distorted cognitive messages and schema that foster his/her vocational stress; replace these messages with positive cognitions (or assign "Negative Thoughts Trigger Negative Feelings" in the Adult Psychotherapy Homework Planner by Beacher Bottoms). Confront the client's pattern of catastrophizing situations leading to immobilizing anxiety; replace these messages with realistic thoughts. Teach the client calming/relaxation skills (e.g., applied relaxation, progressive muscle relaxation, cue controlled relaxation, mindful breathing, biofeedback) and how to discriminate better between relaxation and tension; teach the client how to apply these skills to his/her daily life (e.g., New Directions in Progressive Muscle Relaxation by Fara Hone, and Hazlett-Stevens; The Relaxation and Stress Reduction Workbook by Jadine May, and [place]). Train the client in assertiveness skills or refer to assertiveness training class that teaches effective communication of needs and feelings without aggression or defensiveness.  Diagnosis:Generalized anxiety disorder  Adjustment disorder with depressed mood  Attention deficit hyperactivity disorder (ADHD), inattentive type, mild  Plan:  -consider plan for how to speak with teammate Takoya -meet again on Monday, Nov 23, 2023 at 8am.

## 2023-11-19 ENCOUNTER — Ambulatory Visit: Admitting: Professional

## 2023-11-23 ENCOUNTER — Encounter: Payer: Self-pay | Admitting: Professional

## 2023-11-23 ENCOUNTER — Ambulatory Visit (INDEPENDENT_AMBULATORY_CARE_PROVIDER_SITE_OTHER): Admitting: Professional

## 2023-11-23 DIAGNOSIS — F900 Attention-deficit hyperactivity disorder, predominantly inattentive type: Secondary | ICD-10-CM | POA: Diagnosis not present

## 2023-11-23 DIAGNOSIS — F4321 Adjustment disorder with depressed mood: Secondary | ICD-10-CM

## 2023-11-23 DIAGNOSIS — F411 Generalized anxiety disorder: Secondary | ICD-10-CM | POA: Diagnosis not present

## 2023-11-23 NOTE — Progress Notes (Signed)
 [facility] Counselor/Therapist Progress Note  Patient ID: SHAIA TASSONE, MRN: [id number],    Date: 11/23/2023  Time Spent: 48 minutes [id number]   Treatment Type: Individual Therapy  Risk Assessment: Danger to Self:  No Self-injurious Behavior: No Danger to Others: No  Subjective: This session was held via video teletherapy. The patient consented to video teletherapy and was located in her car during this session. She is aware it is the responsibility of the patient to secure confidentiality on her end of the session. The provider was in a private home office for the duration of this session.    The patient arrived on time for her Caregility appointment.  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 -the therapist pushed hard on the lympathic 4-sleep hygiene -children go down at 830 -she plays on phone until she falls asleep -she has tried to limit her cell phone usage 5-marital -husband prefers intimate time in her desire to do it at night -no other form of intimacy (no holding hands, no sitting next to each other) -she thinks $60 1/mo -they used to hike and backpack together -converse with spouse about her needs     Treatment Plan Problems: Anxiety, Vocational Stress, Low Self-Esteem, Eating Disorders and Obesity Symptoms: Hypervigilance (e.g., feeling constantly on edge, experiencing concentration difficulties, having trouble falling or  staying asleep, exhibiting a general state of irritability). Motor tension (e.g., restlessness, tiredness, shakiness, muscle tension). Excessive and/or unrealistic worry that is difficult to control occurring more days than not for at least 6 months about a number of events or activities. Undue influence of body weight or shape on self-evaluation. Recurrent episodes of binge eating (a large amount of food is consumed in a relatively short period of time and there is a sense of lack of control over the eating behavior). Feeling disgusted with oneself, depressed, or very guilty after eating too much. Makes self-disparaging remarks; sees self as unattractive, worthless, a loser, a burden, unimportant; takes blame easily. Fear of rejection by others, especially peer group. Inability to identify positive characteristics of self. Feelings of depression and anxiety related to complaints of job dissatisfaction or the stress of employment responsibilities. Goals: Stabilize anxiety level while increasing ability to function on a daily basis. Resolve the core conflict that is the source of anxiety. Enhance ability to effectively cope with the full variety of life's worries and anxieties. Develop coping strategies (e.g., feeling identification, problem-solving, assertiveness) to address emotional issues that could lead to relapse of the eating disorder. Develop healthy cognitive patterns and beliefs about self that lead to positive identity and prevent a relapse of the eating disorder. Terminate overeating and implement lifestyle changes that lead to weight loss and improved health. Restore normal eating patterns, healthy weight maintenance, and a realistic appraisal of body size. Develop a consistent, positive self-image. Demonstrate improved self-esteem through more pride in appearance, more assertiveness, greater eye contact, and identification of positive traits in self-talk messages. Establish an inward  sense of self-worth, confidence,  and competence. Increase job satisfaction and performance due to implementation of assertiveness and stress management strategies. Objectives target date for all objectives is 11/16/2024: Describe situations, thoughts, feelings, and actions associated with anxieties and worries, their impact on functioning, and attempts to resolve them. Learn and implement a strategy to limit the association between various environmental settings and worry, delaying the worry until a designated "worry time." Learn and implement calming skills to reduce overall anxiety and manage anxiety symptoms. Verbalize an understanding of the cognitive, physiological, and behavioral components of anxiety and its treatment. Verbalize an understanding of the role that cognitive biases play in excessive irrational worry and persistent anxiety symptoms. Identify, challenge, and replace biased, fearful self-talk with positive, realistic, and empowering self-talk. Learn and implement problem-solving strategies for realistically addressing worries. Identify and engage in pleasant activities on a daily basis. Honestly describe the pattern of eating including types, amounts, and frequency of food consumed or hoarded. Implement relapse prevention strategies for managing possible future anxiety symptoms. Verbalize an understanding of relapse prevention and the distinction between a lapse and a relapse. State a basis for positive identity that is not based on weight and appearance but on character, traits, relationships, and intrinsic value. Identify and develop a list of high-risk situations for unhealthy eating or weight loss practices. Increase insight into the historical and current sources of low self-esteem. Decrease the frequency of negative self-descriptive statements and increase frequency of positive self-descriptive statements. Identify and replace negative self-talk messages used to reinforce low  self-esteem. Take verbal responsibility for accomplishments without discounting. Increase the frequency of assertive behaviors. Articulate a plan to be proactive in trying to get identified needs met. Demonstrate an increased ability to identify and express personal feelings. Identify positive traits and talents about self. Learn and implement problem-solving skills. Implement assertiveness skills. Identify and replace distorted cognitive messages associated with feelings of job stress. Learn and implement calming skills to reduce overall anxiety and manage anxiety symptoms. Interventions: Assess the historical course of the disorder including the amount, type, and pattern of the client's food intake (e.g., too little food, too much food, binge eating, or hoarding food); perceived personal and interpersonal triggers and personal goals. Assess the nature of any external cues (e.g., persons, objects, and situations) and internal cues (thoughts, images, and impulses) that precipitate the client's uncontrolled eating and/or compensatory weight management behaviors. Direct and assist the client in construction of a hierarchy of high-risk internal and external triggers for uncontrolled eating and/or compensatory weight management behaviors. Assist the client in identifying a basis for self-worth apart from body image by reviewing his/her talents, successes, positive traits, importance to others, and intrinsic spiritual value. Discuss with the client the distinction between a lapse and relapse, associating a lapse with an initial and reversible return of distress, urges, or to avoid, and relapse with the decision to return to the cycle of maladaptive thoughts and actions (e.g., feeling anxious, binging, then purging). Identify with the client future situations or circumstances in which lapses could occur. Instruct the client to routinely use strategies learned in therapy (e.g., continued exposure to  previous external or internal cues that arise) to prevent relapse. Assign the client the exercise of identifying his/her positive physical characteristics in a mirror to help him/her become more comfortable with himself/herself. Ask the client to keep building a list of positive traits and have him/her read the list at the beginning and end of each session (or assign "Acknowledging My Strengths" or "What Are My Good Qualities?" in  the Adult Psychotherapy Homework Planner by [facility]); reinforce the client's positive self-descriptive statements. Assist the client in identifying and labeling emotions. Assist the client in identifying and verbalizing his/her needs, met and unmet. Train the client in assertiveness or refer him/her to a group that will educate and facilitate assertiveness skills via lectures and assignments. Ask the client to list accomplishments; process the integration of these into his/her self-image. Help the client become aware of his/her fear of rejection and its connection with past rejection or abandonment experiences; begin to contrast past experiences of pain with present experiences of acceptance and competence. Assist the client in becoming aware of how he/she expresses or acts out negative feelings about himself/herself. Help the client reframe his/her negative assessment of himself/herself. Assist the client in developing positive self-talk as a way of boosting his/her confidence and self-image (or assign "Positive Self-Talk" in the Adult Psychotherapy Homework Planner by Beacher Bottoms). Help the client identify his/her distorted, negative beliefs about self and the world and replace these messages with more realistic, affirmative messages (or assign "Journal and Replace Self-Defeating Thoughts" in the Adult Psychotherapy Homework Planner by [facility] or read What to Say When You Talk to Yourself by Helmstetter). Ask the client to describe his/her past experiences of anxiety and their  impact on functioning; assess the focus, excessiveness, and uncontrollability of the worry and the type, frequency, intensity, and duration of his/her anxiety symptoms (consider using a structured interview such as The Anxiety Disorders Interview Schedule-Adult Version). Explore the client's schema and self-talk that mediate his/her fear response; assist him/her in challenging the biases; replace the distorted messages with reality-based alternatives and positive, realistic self-talk that will increase his/her self-confidence in coping with irrational fears (see Cognitive Therapy of Anxiety Disorders by Anderson Kaufman). Teach the client problem-solving strategies involving specifically defining a problem, generating options for addressing it, evaluating the pros and cons of each option, selecting and implementing an optional action, and reevaluating and refining the action (or assign "Applying Problem-Solving to Interpersonal Conflict" in the Adult Psychotherapy Homework Planner by Beacher Bottoms). Engage the client in behavioral activation, increasing the client's contact with sources of reward, identifying processes that inhibit activation, and teaching skills to solve life problems (or assign "Identify and Schedule Pleasant Activities" in the Adult Psychotherapy Homework Planner by Beacher Bottoms); use behavioral techniques such as instruction, rehearsal, role-playing, role reversal as needed to assist adoption into the client's daily life; reinforce success. Discuss how generalized anxiety typically involves excessive worry about unrealistic threats, various bodily expressions of tension, overarousal, and hypervigilance, and avoidance of what is threatening that interact to maintain the problem (see Mastery of Your Anxiety and Worry: Therapist Guide by Jame Maze, and [place]; Treating Generalized Anxiety Disorder by Rygh and Joya Nissen). Teach the client calming/relaxation skills (e.g., applied relaxation,  progressive muscle relaxation, cue controlled relaxation; mindful breathing; biofeedback) and how to discriminate better between relaxation and tension; teach the client how to apply these skills to his/her daily life (e.g., New Directions in Progressive Muscle Relaxation by Fara Hone, and Hazlett-Stevens; Treating Generalized Anxiety Disorder by Rygh and Joya Nissen). Assign the client to read about progressive muscle relaxation and other calming strategies in relevant books or treatment manuals (e.g., Progressive Relaxation Training by Rodolfo Clan and Arvil Birks; Mastery of Your Anxiety and Worry: Workbook by Rodney Clamp). Explain the rationale for using a worry time as well as how it is to be used; agree upon and implement a worry time with the client. Teach the client how to recognize, stop, and  postpone worry to the agreed upon worry time using skills such as thought stopping, relaxation, and redirecting attention (or assign "Making Use of the Thought-Stopping Technique" and/or "Worry Time" in the Adult Psychotherapy Homework Planner by Jongsma to assist skill development); encourage use in daily life; review and reinforce success while providing corrective feedback toward improvement. Discuss examples demonstrating that unrealistic worry typically overestimates the probability of threats and underestimates or overlooks the client's ability to manage realistic demands (or assign "Past Successful Anxiety Coping" in the Adult Psychotherapy Homework Planner by [facility]). Conduct Problem-Solving Therapy (see Problem-Solving Therapy by Donnice Gale and Nezu) using techniques such as psychoeducation, modeling, and role-playing to teach the client problem-solving skills (i.e., defining a problem specifically, generating possible solutions, evaluating the pros and cons of each solution, selecting and implementing a plan of action, evaluating the efficacy of the plan, accepting or revising the plan);  role-play application of the problem-solving skill to a real life issue (or assign "Applying Problem-Solving to Interpersonal Conflict" in the Adult Psychotherapy Homework Planner by Beacher Bottoms). Probe and clarify the client's emotions surrounding his/her vocational stress. Assess the client's distorted cognitive messages and schema that foster his/her vocational stress; replace these messages with positive cognitions (or assign "Negative Thoughts Trigger Negative Feelings" in the Adult Psychotherapy Homework Planner by Beacher Bottoms). Confront the client's pattern of catastrophizing situations leading to immobilizing anxiety; replace these messages with realistic thoughts. Teach the client calming/relaxation skills (e.g., applied relaxation, progressive muscle relaxation, cue controlled relaxation, mindful breathing, biofeedback) and how to discriminate better between relaxation and tension; teach the client how to apply these skills to his/her daily life (e.g., New Directions in Progressive Muscle Relaxation by Fara Hone, and Hazlett-Stevens; The Relaxation and Stress Reduction Workbook by Jadine May, and [place]). Train the client in assertiveness skills or refer to assertiveness training class that teaches effective communication of needs and feelings without aggression or defensiveness.  Diagnosis:Generalized anxiety disorder  Adjustment disorder with depressed mood  Attention deficit hyperactivity disorder (ADHD), inattentive type, mild  Plan:  -meet again on Tuesday, Dec 01, 2023 at 3pm.

## 2023-12-01 ENCOUNTER — Ambulatory Visit: Admitting: Professional

## 2023-12-03 ENCOUNTER — Ambulatory Visit: Payer: Self-pay | Admitting: Professional

## 2023-12-08 ENCOUNTER — Ambulatory Visit: Admitting: Professional

## 2023-12-15 ENCOUNTER — Ambulatory Visit: Admitting: Professional

## 2023-12-25 ENCOUNTER — Ambulatory Visit: Admitting: Professional

## 2023-12-25 ENCOUNTER — Encounter: Payer: Self-pay | Admitting: Professional

## 2023-12-25 DIAGNOSIS — F4321 Adjustment disorder with depressed mood: Secondary | ICD-10-CM

## 2023-12-25 DIAGNOSIS — F411 Generalized anxiety disorder: Secondary | ICD-10-CM | POA: Diagnosis not present

## 2023-12-25 DIAGNOSIS — F900 Attention-deficit hyperactivity disorder, predominantly inattentive type: Secondary | ICD-10-CM

## 2023-12-25 NOTE — Progress Notes (Signed)
 [facility] Counselor/Therapist Progress Note  Patient ID: Samantha Ochoa, MRN: [id number],    Date: 12/25/2023  Time Spent: 56 minutes [id number]   Treatment Type: Individual Therapy  Risk Assessment: Danger to Self:  No Self-injurious Behavior: No Danger to Others: No  Subjective: This session was held via video teletherapy. The patient consented to video teletherapy and was located in her car during this session. She is aware it is the responsibility of the patient to secure confidentiality on her end of the session. The provider was in a private home office for the duration of this session.    The patient arrived on time for her Caregility appointment.  1-challenges this week -woke up at 4 could not sleep -pt reports she wonders if it is an energy issue -when her energy gets low her mood changes -doesn't recognize any triggers -she questions if she takes on too much 2-energy suckers -TV-helps her relax but she gets okay with sitting -worrying gets worse -overdoing but under performing -dogs barking (loud noises) 3-free time -thinks she can get so much done -they want their children to read and do math over summer -her daughter almost failed first grade and her issue was reading -her daughter fights her over reading and "is whiny in general" 4-feels overwhelmed -feels she is primarily responsible to sell their second home -needs TLC for cleaning and when she goes with children they whine the entire time -by herself she will clean the entire day and get hyper-focused -let her primary home go during the year and if needs a deep cleaning -5-coping strategies -time for self-care -schedule one task morning/afternoon -make and take time to refresh -consider summer camps -consider an older teenage girl as a reading tutor -consider husband has children one night per week by himself -husband does "school" at least once per week  Treatment Plan Problems:  Anxiety, Vocational Stress, Low Self-Esteem, Eating Disorders and Obesity Symptoms: Hypervigilance (e.g., feeling constantly on edge, experiencing concentration difficulties, having trouble falling or staying asleep, exhibiting a general state of irritability). Motor tension (e.g., restlessness, tiredness, shakiness, muscle tension). Excessive and/or unrealistic worry that is difficult to control occurring more days than not for at least 6 months about a number of events or activities. Undue influence of body weight or shape on self-evaluation. Recurrent episodes of binge eating (a large amount of food is consumed in a relatively short period of time and there is a sense of lack of control over the eating behavior). Feeling disgusted with oneself, depressed, or very guilty after eating too much. Makes self-disparaging remarks; sees self as unattractive, worthless, a loser, a burden, unimportant; takes blame easily. Fear of rejection by others, especially peer group. Inability to identify positive characteristics of self. Feelings of depression and anxiety related to complaints of job dissatisfaction or the stress of employment responsibilities. Goals: Stabilize anxiety level while increasing ability to function on a daily basis. Resolve the core conflict that is the source of anxiety. Enhance ability to effectively cope with the full variety of life's worries and anxieties. Develop coping strategies (e.g., feeling identification, problem-solving, assertiveness) to address emotional issues that could lead to relapse of the eating disorder. Develop healthy cognitive patterns and beliefs about self that lead to positive identity and prevent a relapse of the eating disorder. Terminate overeating and implement lifestyle changes that lead to weight loss and improved health. Restore normal eating patterns, healthy weight maintenance, and a realistic appraisal of body size. Develop a consistent, positive  self-image. Demonstrate improved self-esteem through more pride in appearance, more assertiveness, greater eye contact, and identification of positive traits in self-talk messages. Establish an inward sense of self-worth, confidence, and competence. Increase job satisfaction and performance due to implementation of assertiveness and stress management strategies. Objectives target date for all objectives is 11/16/2024: Describe situations, thoughts, feelings, and actions associated with anxieties and worries, their impact on functioning, and attempts to resolve them. Learn and implement a strategy to limit the association between various environmental settings and worry, delaying the worry until a designated "worry time." Learn and implement calming skills to reduce overall anxiety and manage anxiety symptoms. Verbalize an understanding of the cognitive, physiological, and behavioral components of anxiety and its treatment. Verbalize an understanding of the role that cognitive biases play in excessive irrational worry and persistent anxiety symptoms. Identify, challenge, and replace biased, fearful self-talk with positive, realistic, and empowering self-talk. Learn and implement problem-solving strategies for realistically addressing worries. Identify and engage in pleasant activities on a daily basis. Honestly describe the pattern of eating including types, amounts, and frequency of food consumed or hoarded. Implement relapse prevention strategies for managing possible future anxiety symptoms. Verbalize an understanding of relapse prevention and the distinction between a lapse and a relapse. State a basis for positive identity that is not based on weight and appearance but on character, traits, relationships, and intrinsic value. Identify and develop a list of high-risk situations for unhealthy eating or weight loss practices. Increase insight into the historical and current sources of low  self-esteem. Decrease the frequency of negative self-descriptive statements and increase frequency of positive self-descriptive statements. Identify and replace negative self-talk messages used to reinforce low self-esteem. Take verbal responsibility for accomplishments without discounting. Increase the frequency of assertive behaviors. Articulate a plan to be proactive in trying to get identified needs met. Demonstrate an increased ability to identify and express personal feelings. Identify positive traits and talents about self. Learn and implement problem-solving skills. Implement assertiveness skills. Identify and replace distorted cognitive messages associated with feelings of job stress. Learn and implement calming skills to reduce overall anxiety and manage anxiety symptoms. Interventions: Assess the historical course of the disorder including the amount, type, and pattern of the client's food intake (e.g., too little food, too much food, binge eating, or hoarding food); perceived personal and interpersonal triggers and personal goals. Assess the nature of any external cues (e.g., persons, objects, and situations) and internal cues (thoughts, images, and impulses) that precipitate the client's uncontrolled eating and/or compensatory weight management behaviors. Direct and assist the client in construction of a hierarchy of high-risk internal and external triggers for uncontrolled eating and/or compensatory weight management behaviors. Assist the client in identifying a basis for self-worth apart from body image by reviewing his/her talents, successes, positive traits, importance to others, and intrinsic spiritual value. Discuss with the client the distinction between a lapse and relapse, associating a lapse with an initial and reversible return of distress, urges, or to avoid, and relapse with the decision to return to the cycle of maladaptive thoughts and actions (e.g., feeling anxious,  binging, then purging). Identify with the client future situations or circumstances in which lapses could occur. Instruct the client to routinely use strategies learned in therapy (e.g., continued exposure to previous external or internal cues that arise) to prevent relapse. Assign the client the exercise of identifying his/her positive physical characteristics in a mirror to help him/her become more comfortable with himself/herself. Ask the client to keep building a list  of positive traits and have him/her read the list at the beginning and end of each session (or assign "Acknowledging My Strengths" or "What Are My Good Qualities?" in the Adult Psychotherapy Homework Planner by [facility]); reinforce the client's positive self-descriptive statements. Assist the client in identifying and labeling emotions. Assist the client in identifying and verbalizing his/her needs, met and unmet. Train the client in assertiveness or refer him/her to a group that will educate and facilitate assertiveness skills via lectures and assignments. Ask the client to list accomplishments; process the integration of these into his/her self-image. Help the client become aware of his/her fear of rejection and its connection with past rejection or abandonment experiences; begin to contrast past experiences of pain with present experiences of acceptance and competence. Assist the client in becoming aware of how he/she expresses or acts out negative feelings about himself/herself. Help the client reframe his/her negative assessment of himself/herself. Assist the client in developing positive self-talk as a way of boosting his/her confidence and self-image (or assign "Positive Self-Talk" in the Adult Psychotherapy Homework Planner by Beacher Bottoms). Help the client identify his/her distorted, negative beliefs about self and the world and replace these messages with more realistic, affirmative messages (or assign "Journal and Replace  Self-Defeating Thoughts" in the Adult Psychotherapy Homework Planner by [facility] or read What to Say When You Talk to Yourself by Helmstetter). Ask the client to describe his/her past experiences of anxiety and their impact on functioning; assess the focus, excessiveness, and uncontrollability of the worry and the type, frequency, intensity, and duration of his/her anxiety symptoms (consider using a structured interview such as The Anxiety Disorders Interview Schedule-Adult Version). Explore the client's schema and self-talk that mediate his/her fear response; assist him/her in challenging the biases; replace the distorted messages with reality-based alternatives and positive, realistic self-talk that will increase his/her self-confidence in coping with irrational fears (see Cognitive Therapy of Anxiety Disorders by Anderson Kaufman). Teach the client problem-solving strategies involving specifically defining a problem, generating options for addressing it, evaluating the pros and cons of each option, selecting and implementing an optional action, and reevaluating and refining the action (or assign "Applying Problem-Solving to Interpersonal Conflict" in the Adult Psychotherapy Homework Planner by Beacher Bottoms). Engage the client in behavioral activation, increasing the client's contact with sources of reward, identifying processes that inhibit activation, and teaching skills to solve life problems (or assign "Identify and Schedule Pleasant Activities" in the Adult Psychotherapy Homework Planner by Beacher Bottoms); use behavioral techniques such as instruction, rehearsal, role-playing, role reversal as needed to assist adoption into the client's daily life; reinforce success. Discuss how generalized anxiety typically involves excessive worry about unrealistic threats, various bodily expressions of tension, overarousal, and hypervigilance, and avoidance of what is threatening that interact to maintain the problem (see Mastery  of Your Anxiety and Worry: Therapist Guide by Jame Maze, and Barlow; Treating Generalized Anxiety Disorder by Rygh and Joya Nissen). Teach the client calming/relaxation skills (e.g., applied relaxation, progressive muscle relaxation, cue controlled relaxation; mindful breathing; biofeedback) and how to discriminate better between relaxation and tension; teach the client how to apply these skills to his/her daily life (e.g., New Directions in Progressive Muscle Relaxation by Fara Hone, and Hazlett-Stevens; Treating Generalized Anxiety Disorder by Rygh and Joya Nissen). Assign the client to read about progressive muscle relaxation and other calming strategies in relevant books or treatment manuals (e.g., Progressive Relaxation Training by Rodolfo Clan and Arvil Birks; Mastery of Your Anxiety and Worry: Workbook by Rodney Clamp). Explain the rationale for using a  worry time as well as how it is to be used; agree upon and implement a worry time with the client. Teach the client how to recognize, stop, and postpone worry to the agreed upon worry time using skills such as thought stopping, relaxation, and redirecting attention (or assign "Making Use of the Thought-Stopping Technique" and/or "Worry Time" in the Adult Psychotherapy Homework Planner by Jongsma to assist skill development); encourage use in daily life; review and reinforce success while providing corrective feedback toward improvement. Discuss examples demonstrating that unrealistic worry typically overestimates the probability of threats and underestimates or overlooks the client's ability to manage realistic demands (or assign "Past Successful Anxiety Coping" in the Adult Psychotherapy Homework Planner by [facility]). Conduct Problem-Solving Therapy (see Problem-Solving Therapy by Donnice Gale and Nezu) using techniques such as psychoeducation, modeling, and role-playing to teach the client problem-solving skills (i.e., defining a problem  specifically, generating possible solutions, evaluating the pros and cons of each solution, selecting and implementing a plan of action, evaluating the efficacy of the plan, accepting or revising the plan); role-play application of the problem-solving skill to a real life issue (or assign "Applying Problem-Solving to Interpersonal Conflict" in the Adult Psychotherapy Homework Planner by Beacher Bottoms). Probe and clarify the client's emotions surrounding his/her vocational stress. Assess the client's distorted cognitive messages and schema that foster his/her vocational stress; replace these messages with positive cognitions (or assign "Negative Thoughts Trigger Negative Feelings" in the Adult Psychotherapy Homework Planner by Beacher Bottoms). Confront the client's pattern of catastrophizing situations leading to immobilizing anxiety; replace these messages with realistic thoughts. Teach the client calming/relaxation skills (e.g., applied relaxation, progressive muscle relaxation, cue controlled relaxation, mindful breathing, biofeedback) and how to discriminate better between relaxation and tension; teach the client how to apply these skills to his/her daily life (e.g., New Directions in Progressive Muscle Relaxation by Fara Hone, and Hazlett-Stevens; The Relaxation and Stress Reduction Workbook by Jadine May, and [place]). Train the client in assertiveness skills or refer to assertiveness training class that teaches effective communication of needs and feelings without aggression or defensiveness.  Diagnosis:Generalized anxiety disorder  Adjustment disorder with depressed mood  Attention deficit hyperactivity disorder (ADHD), inattentive type, mild  Plan:  -meet again on Wednesday, January 27, 2024 at 3pm.

## 2024-01-26 ENCOUNTER — Telehealth: Payer: Self-pay | Admitting: Professional

## 2024-01-26 NOTE — Telephone Encounter (Signed)
 Copied from CRM [id number]. Topic: Appointments - Appointment Cancel/Reschedule >> Jan 26, 2024 12:20 PM Geroldine GRADE wrote: Patient is calling in because she needs to cancel all of her counselor appointments with  [facility] Nathanel collins

## 2024-01-27 ENCOUNTER — Ambulatory Visit: Payer: Self-pay | Admitting: Professional

## 2024-02-08 ENCOUNTER — Ambulatory Visit: Payer: Self-pay | Admitting: Professional

## 2024-02-24 ENCOUNTER — Ambulatory Visit: Admitting: Professional

## 2024-03-08 ENCOUNTER — Ambulatory Visit: Admitting: Professional

## 2024-03-29 ENCOUNTER — Ambulatory Visit: Admitting: Professional

## 2024-04-05 ENCOUNTER — Encounter: Payer: Self-pay | Admitting: Family Medicine

## 2024-04-05 ENCOUNTER — Ambulatory Visit (INDEPENDENT_AMBULATORY_CARE_PROVIDER_SITE_OTHER): Payer: Self-pay | Admitting: Family Medicine

## 2024-04-05 VITALS — BP 118/70 | HR 69 | Ht 65.0 in | Wt 184.0 lb

## 2024-04-05 DIAGNOSIS — Z23 Encounter for immunization: Secondary | ICD-10-CM

## 2024-04-05 DIAGNOSIS — F909 Attention-deficit hyperactivity disorder, unspecified type: Secondary | ICD-10-CM

## 2024-04-05 DIAGNOSIS — E669 Obesity, unspecified: Secondary | ICD-10-CM | POA: Diagnosis not present

## 2024-04-05 DIAGNOSIS — F418 Other specified anxiety disorders: Secondary | ICD-10-CM | POA: Diagnosis not present

## 2024-04-05 MED ORDER — SEMAGLUTIDE-WEIGHT MANAGEMENT 0.5 MG/0.5ML ~~LOC~~ SOAJ: 0.5000 mg | SUBCUTANEOUS | 0 refills | Status: DC

## 2024-04-05 MED ORDER — SEMAGLUTIDE-WEIGHT MANAGEMENT 0.25 MG/0.5ML ~~LOC~~ SOAJ: 0.2500 mg | SUBCUTANEOUS | 0 refills | Status: AC

## 2024-04-05 MED ORDER — SEMAGLUTIDE-WEIGHT MANAGEMENT 1 MG/0.5ML ~~LOC~~ SOAJ: 1.0000 mg | SUBCUTANEOUS | 0 refills | Status: DC

## 2024-04-05 MED ORDER — VORTIOXETINE HBR 10 MG PO TABS: ORAL_TABLET | ORAL | 3 refills | Status: DC

## 2024-04-05 NOTE — Assessment & Plan Note (Signed)
 She uses Vyvanse  occasionally.  Not using as often because she feels like it also makes her a little more irritable especially with her heightened anxiety at this time.

## 2024-04-05 NOTE — Progress Notes (Signed)
 Samantha Ochoa - 43 y.o. female MRN [id number]  Date of birth: 21-Jan-1981  Subjective Chief Complaint  Patient presents with   Stress    HPI Samantha Ochoa is a 43 y.o. female here today for follow-up.  She reports increased stress leading to more anxiety with some depressive symptoms.  Stress related to work.  She recently started a new job and has had additional responsibilities in addition to her normal job responsibilities added on.  She had previously been on fluoxetine  which was helpful for mood but did not like sexual side effects that she experienced with this.  She would be willing to try something different to help with managing her symptoms.  She may want to see a therapist as well.  She does feel that current strength of Vyvanse  is helpful but has not been taking as often because she feels a little more irritable due to her anxiety.  She would like to restart Wegovy .  She did well with this previously.  She has found running to be an outlet for her stress but has been unable and less motivated to do so because of her weight.   ROS:  A comprehensive ROS was completed and negative except as noted per HPI    Allergies  Allergen Reactions   Azithromycin Hives and Rash   Erythromycin Rash   Buspirone  Hcl Other (See Comments)    'felt weird'    Past Medical History:  Diagnosis Date   Anxiety    Asthma     Past Surgical History:  Procedure Laterality Date   BLADDER SURGERY  1991    Social History   Socioeconomic History   Marital status: Married    Spouse name: Not on file   Number of children: Not on file   Years of education: Not on file   Highest education level: Bachelor's degree (e.g., BA, AB, BS)  Occupational History   Not on file  Tobacco Use   Smoking status: Never   Smokeless tobacco: Never  Vaping Use   Vaping status: Never Used  Substance and Sexual Activity   Alcohol use: Yes    Alcohol/week: 1.0 standard drink of alcohol    Types: 1  Standard drinks or equivalent per week   Drug use: Never   Sexual activity: Yes    Birth control/protection: I.U.D.  Other Topics Concern   Not on file  Social History Narrative   Not on file   Social Drivers of Health   Financial Resource Strain: Low Risk  (09/26/2023)   Received from [facility]   Overall Financial Resource Strain (CARDIA)    Difficulty of Paying Living Expenses: Not hard at all  Food Insecurity: No Food Insecurity (09/26/2023)   Received from [facility]   Hunger Vital Sign    Within the past 12 months, you worried that your food would run out before you got the money to buy more.: Never true    Within the past 12 months, the food you bought just didn't last and you didn't have money to get more.: Never true  Transportation Needs: No Transportation Needs (09/26/2023)   Received from [facility] - Transportation    Lack of Transportation (Medical): No    Lack of Transportation (Non-Medical): No  Physical Activity: Sufficiently Active (09/26/2023)   Received from [facility]   Exercise Vital Sign    On average, how many days per week do you engage in moderate to strenuous exercise (like a  brisk walk)?: 2 days    On average, how many minutes do you engage in exercise at this level?: 100 min  Stress: No Stress Concern Present (09/26/2023)   Received from [facility] - Occupational Stress Questionnaire    Feeling of Stress : Only a little  Social Connections: Socially Integrated (09/26/2023)   Received from [facility]   Social Network    How would you rate your social network (family, work, friends)?: Good participation with social networks    Family History  Problem Relation Age of Onset   Breast cancer Mother    Hypertension Mother    Heart disease Father    Seizures Sister     Health Maintenance  Topic Date Due   HIV Screening  Never done   Hepatitis C Screening  Never done   Pneumococcal  Vaccine (1 of 2 - PCV) Never done   Hepatitis B Vaccines 19-59 Average Risk (1 of 3 - 19+ 3-dose series) Never done   HPV VACCINES (1 - 3-dose SCDM series) Never done   Cervical Cancer Screening (HPV/Pap Cotest)  09/26/2020   Mammogram  06/13/2023   COVID-19 Vaccine (4 - 2025-26 season) 03/21/2024   DTaP/Tdap/Td (5 - Td or Tdap) 04/07/2027   Influenza Vaccine  Completed   Meningococcal B Vaccine  Aged Out     ----------------------------------------------------------------------------------------------------------------------------------------------------------------------------------------------------------------- Physical Exam BP 118/70 (BP Location: Left Arm, Patient Position: Sitting, Cuff Size: Large)   Pulse 69   Ht 5' 5 (1.651 m)   Wt 184 lb (83.5 kg)   SpO2 99%   BMI 30.62 kg/m   Physical Exam Constitutional:      Appearance: Normal appearance.  HENT:     Head: Normocephalic and atraumatic.  Neurological:     General: No focal deficit present.     Mental Status: She is alert.  Psychiatric:     Comments: Tearful when discussing stressors related to her job.     ------------------------------------------------------------------------------------------------------------------------------------------------------------------------------------------------------------------- Assessment and Plan  Anxiety with depression Physical strategies to help manage her stress and anxiety.  She can consider addition of magnesium glycinate at bedtime to help with sleep and anxiety.  Additionally, we will add Trintellix .  She did try fluoxetine  previously and had undesirable sexual side effects related to this.  I am concerned that she would have the same side effects with Lexapro  as well as sertraline.  ADHD She uses Vyvanse  occasionally.  Not using as often because she feels like it also makes her a little more irritable especially with her heightened anxiety at this time.  Obesity  (BMI 30-39.9) She has done well with Wegovy  in the past.  Will add this back on.  She does not have any contraindications to Wegovy  use.  She will follow a physician directed diet and exercise plan while using this medication as well.   Meds ordered this encounter  Medications   semaglutide -weight management (WEGOVY ) 0.25 MG/0.5ML SOAJ SQ injection    Sig: Inject 0.25 mg into the skin once a week for 28 days.    Dispense:  2 mL    Refill:  0   semaglutide -weight management (WEGOVY ) 0.5 MG/0.5ML SOAJ SQ injection    Sig: Inject 0.5 mg into the skin once a week for 28 days.    Dispense:  2 mL    Refill:  0   semaglutide -weight management (WEGOVY ) 1 MG/0.5ML SOAJ SQ injection    Sig: Inject 1 mg into the skin once a week  for 28 days.    Dispense:  2 mL    Refill:  0   vortioxetine  HBr (TRINTELLIX ) 10 MG TABS tablet    Sig: Take 1/2 tab po daily x7 days then increase to a full tab    Dispense:  30 tablet    Refill:  3    No follow-ups on file.

## 2024-04-05 NOTE — Assessment & Plan Note (Signed)
 Physical strategies to help manage her stress and anxiety.  She can consider addition of magnesium glycinate at bedtime to help with sleep and anxiety.  Additionally, we will add Trintellix .  She did try fluoxetine  previously and had undesirable sexual side effects related to this.  I am concerned that she would have the same side effects with Lexapro  as well as sertraline.

## 2024-04-05 NOTE — Patient Instructions (Signed)
 Trintellix  savings card:   Enroll via Text Text 'TSAVE' to [phone] to download your Savings Card directly to your phone

## 2024-04-05 NOTE — Assessment & Plan Note (Signed)
 She has done well with Wegovy  in the past.  Will add this back on.  She does not have any contraindications to Wegovy  use.  She will follow a physician directed diet and exercise plan while using this medication as well.

## 2024-04-12 ENCOUNTER — Telehealth: Payer: Self-pay | Admitting: Pharmacy Technician

## 2024-04-12 ENCOUNTER — Other Ambulatory Visit (HOSPITAL_COMMUNITY): Payer: Self-pay

## 2024-04-12 NOTE — Telephone Encounter (Signed)
 Clinical questions and answers have been submitted. Status is pending.

## 2024-04-12 NOTE — Telephone Encounter (Signed)
 Pharmacy Patient Advocate Encounter  Received notification from [employer] that Prior Authorization for Wegovy  0.25MG /0.5ML auto-injectors has been APPROVED from 04/12/2024 to 04/12/2025. Ran test claim, Copay is $1,301.93. This test claim was processed through [facility]- copay amounts may vary at other pharmacies due to pharmacy/plan contracts, or as the patient moves through the different stages of their insurance plan.   PA #/Case ID/Reference #: [id number]    Copay applied to the deductible.   Patient can access a saving card directly from the manufacture website to help her save money on her copay. She will need to enroll online and then provide her card information to her pharmacy for processing.

## 2024-04-12 NOTE — Telephone Encounter (Signed)
 Pharmacy Patient Advocate Encounter   Received notification from Onbase that prior authorization for Trintellix  10mg  is required/requested.   Insurance verification completed.   The patient is insured through [facility] .   Per test claim: The current 30 day co-pay is, $496.45.  No PA needed at this time. This test claim was processed through [facility]- copay amounts may vary at other pharmacies due to pharmacy/plan contracts, or as the patient moves through the different stages of their insurance plan.    Copay applied to the deductible. Patient can access a savings card via the manufacture website to help save on her out of pocket. Patient will need to enroll online and then provide the card information to her pharmacy for processing.

## 2024-04-12 NOTE — Telephone Encounter (Signed)
 Pharmacy Patient Advocate Encounter   Received notification from Onbase that prior authorization for Wegovy  0.25MG /0.5ML auto-injectors is required/requested.   Insurance verification completed.   The patient is insured through [facility] .   Per test claim: PA required; PA started via CoverMyMeds. KEY [id number] . Waiting for clinical questions to populate.

## 2024-04-20 ENCOUNTER — Telehealth: Payer: Self-pay | Admitting: Pharmacy Technician

## 2024-04-20 ENCOUNTER — Other Ambulatory Visit (HOSPITAL_COMMUNITY): Payer: Self-pay

## 2024-04-20 NOTE — Telephone Encounter (Signed)
 Pharmacy Patient Advocate Encounter   Received notification from Onbase that prior authorization for Trintellix  (formerly Brintellix) 10MG  tablets is required/requested.   Insurance verification completed.   The patient is insured through [facility].   Per test claim: PA required; PA submitted to above mentioned insurance via Latent Key/confirmation #/[id number] Status is pending

## 2024-04-20 NOTE — Telephone Encounter (Signed)
 Pharmacy Patient Advocate Encounter  Received notification from [facility] that Prior Authorization for Trintellix  (formerly Brintellix) 10MG  tablets has been APPROVED from 04/20/2024 to 04/20/2025. Ran test claim, Copay is $45.00. This test claim was processed through [facility]- copay amounts may vary at other pharmacies due to pharmacy/plan contracts, or as the patient moves through the different stages of their insurance plan.   PA #/Case ID/Reference #: [id number]

## 2024-04-26 ENCOUNTER — Ambulatory Visit: Payer: Self-pay | Admitting: *Deleted

## 2024-04-26 NOTE — Telephone Encounter (Signed)
 Appt changed due to patient just started new medication trintellix  due to not available at pharmacy until yesterday. Appt rescheduled for 05/11/24.        FYI Only or Action Required?: FYI only for provider.  Patient was last seen in primary care on 04/05/2024 by Alvia Bring, DO.  Called Nurse Triage reporting Anxiety.  Symptoms began on going and just started medication PCP prescribed due to not available at pharmacy .  Interventions attempted: Prescription medications: trintellix .  Symptoms are: unchanged.  Triage Disposition: See PCP Within 2 Weeks  Patient/caregiver understands and will follow disposition?: Yes              Copied from CRM #[id number]. Topic: Clinical - Red Word Triage >> Apr 26, 2024  3:50 PM Rosaria BRAVO wrote: Red Word that prompted transfer to Nurse Triage: More anxiety   ----------------------------------------------------------------------- From previous Reason for Contact - Cancel/Reschedule: Patient/patient representative is calling to cancel or reschedule an appointment. Refer to attachments for appointment information. Reason for Disposition  [1] Symptoms of anxiety or panic attack AND [2] is a chronic symptom (recurrent or ongoing AND present > 4 weeks)  Answer Assessment - Initial Assessment Questions Patient called to request to change 6 month f/u appt due to she was unable to start new medication for anxiety until yesterday. Reports trintellix  was not available from pharmacy until yesterday. Canceled appt for 04/28/24 and changed to 05/11/24. Patient reports she is getting ready to be on vacation from work and will be able to try medication before appt.        1. CONCERN: Did anything happen that prompted you to call today?     Patient requesting to push back appt for 6 month f/u due to she just started new medication trintellix  due to medication was not available at pharmacy until yesterday and started medication then. 2.  ANXIETY SYMPTOMS: Can you describe how you (your loved one; patient) have been feeling? (e.g., tense, restless, panicky, anxious, keyed up, overwhelmed, sense of impending doom).      Na  3. ONSET: How long have you been feeling this way? (e.g., hours, days, weeks)     na 4. SEVERITY: How would you rate the level of anxiety? (e.g., 0 - 10; or mild, moderate, severe).     Same as before  5. FUNCTIONAL IMPAIRMENT: How have these feelings affected your ability to do daily activities? Have you had more difficulty than usual doing your normal daily activities? (e.g., getting better, same, worse; self-care, school, work, interactions)     na 6. HISTORY: Have you felt this way before? Have you ever been diagnosed with an anxiety problem in the past? (e.g., generalized anxiety disorder, panic attacks, PTSD). If Yes, ask: How was this problem treated? (e.g., medicines, counseling, etc.)     Yes  7. RISK OF HARM - SUICIDAL IDEATION: Do you ever have thoughts of hurting or killing yourself? If Yes, ask:  Do you have these feelings now? Do you have a plan on how you would do this?     na 8. TREATMENT:  What has been done so far to treat this anxiety? (e.g., medicines, relaxation strategies). What has helped?     Just started new medication yesterday  9. THERAPIST: Do you have a counselor or therapist? If Yes, ask: What is their name?     na 10. POTENTIAL TRIGGERS: Do you drink caffeinated beverages (e.g., coffee, colas, teas), and how much daily? Do you drink alcohol or use any  drugs? Have you started any new medicines recently?       na 11. PATIENT SUPPORT: Who is with you now? Who do you live with? Do you have family or friends who you can talk to?        na 12. OTHER SYMPTOMS: Do you have any other symptoms? (e.g., feeling depressed, trouble concentrating, trouble sleeping, trouble breathing, palpitations or fast heartbeat, chest pain, sweating, nausea, or  diarrhea)       Reports same anxiety issues that PCP has seen her before . Did not elaborate due to patient in meeting at work only wanting to change appt time.  13. PREGNANCY: Is there any chance you are pregnant? When was your last menstrual period?       Na  Protocols used: Anxiety and Panic Attack-A-AH

## 2024-04-28 ENCOUNTER — Ambulatory Visit: Admitting: Family Medicine

## 2024-05-11 ENCOUNTER — Ambulatory Visit: Admitting: Family Medicine

## 2024-05-17 ENCOUNTER — Ambulatory Visit: Admitting: Family Medicine

## 2024-05-17 ENCOUNTER — Encounter: Payer: Self-pay | Admitting: Family Medicine

## 2024-05-17 VITALS — BP 112/73 | HR 77 | Ht 65.0 in | Wt 187.0 lb

## 2024-05-17 DIAGNOSIS — F418 Other specified anxiety disorders: Secondary | ICD-10-CM

## 2024-05-17 DIAGNOSIS — E669 Obesity, unspecified: Secondary | ICD-10-CM

## 2024-05-17 DIAGNOSIS — O360990 Maternal care for other rhesus isoimmunization, unspecified trimester, not applicable or unspecified: Secondary | ICD-10-CM | POA: Insufficient documentation

## 2024-05-17 NOTE — Assessment & Plan Note (Signed)
 She will be following along Trintellix  for about a week so unable to fully tell if this is helpful or not.  Will plan to continue this and follow-up in 2 to 3 months.

## 2024-05-17 NOTE — Progress Notes (Signed)
 Samantha Ochoa - 43 y.o. female MRN [id number]  Date of birth: [birth year]  Subjective Chief Complaint  Patient presents with   Mood   Weight Loss    HPI Samantha Ochoa is a 43 year old female here today for follow-up visit.  She was able to get Trintellix  approved for management of her anxiety and depression.  She has really only been on this for about 1 week.  Had headache initially when starting this but overall is tolerating pretty well.  She reports she continues to have stress related to her job.  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  She has also been less motivated to run and exercise recently which was her outlet for stress previously.  Concerns about weight gain.  Insurance did not cover GLP-1's for weight loss.  She may possibly be interested in compounded tirzepatide.  ROS:  A comprehensive ROS was completed and negative except as noted per HPI. Allergies  Allergen Reactions   Azithromycin Hives and Rash   Erythromycin Rash   Buspirone  Hcl Other (See Comments)    'felt weird'    Past Medical History:  Diagnosis Date   Anxiety    Asthma     Past Surgical History:  Procedure Laterality Date   BLADDER SURGERY  1991    Social History   Socioeconomic History   Marital status: Married    Spouse name: Not on file   Number of children: Not on file   Years of education: Not on file   Highest education level: Bachelor's degree (e.g., BA, AB, BS)  Occupational History   Not on file  Tobacco Use   Smoking status: Never   Smokeless tobacco: Never  Vaping Use   Vaping status: Never Used  Substance and Sexual Activity   Alcohol use: Yes    Alcohol/week: 1.0 standard drink of alcohol    Types: 1 Standard drinks or equivalent per week   Drug use: Never   Sexual activity: Yes    Birth control/protection: I.U.D.  Other Topics Concern   Not on file  Social  History Narrative   Not on file   Social Drivers of Health   Financial Resource Strain: Low Risk  (09/26/2023)   Received from [facility]   Overall Financial Resource Strain (CARDIA)    Difficulty of Paying Living Expenses: Not hard at all  Food Insecurity: No Food Insecurity (09/26/2023)   Received from [facility]   Hunger Vital Sign    Within the past 12 months, you worried that your food would run out before you got the money to buy more.: Never true    Within the past 12 months, the food you bought just didn't last and you didn't have money to get more.: Never true  Transportation Needs: No Transportation Needs (09/26/2023)   Received from [facility] - Transportation    Lack of Transportation (Medical): No    Lack of Transportation (Non-Medical): No  Physical Activity: Sufficiently Active (09/26/2023)   Received from [facility]   Exercise Vital Sign    On average, how many days per week do you engage in moderate to strenuous exercise (like a brisk walk)?: 2 days    On average, how many minutes do you engage in exercise at this level?: 100 min  Stress: No Stress Concern Present (09/26/2023)   Received from [facility]  Occupational Health - Occupational Stress Questionnaire    Feeling of Stress : Only a little  Social Connections: Socially Integrated (09/26/2023)   Received from [facility]   Social Network    How would you rate your social network (family, work, friends)?: Good participation with social networks    Family History  Problem Relation Age of Onset   Breast cancer Mother    Hypertension Mother    Heart disease Father    Seizures Sister     Health Maintenance  Topic Date Due   Cervical Cancer Screening (HPV/Pap Cotest)  09/26/2020   Mammogram  06/13/2023   Pneumococcal Vaccine (1 of 2 - PCV) 05/17/2025 (Originally 12/28/2000)   Hepatitis B Vaccines 19-59 Average Risk (1 of 3 - 19+ 3-dose series) 05/17/2025 (Originally  12/28/2000)   HPV VACCINES (1 - Risk 3-dose SCDM series) 05/17/2025 (Originally 12/28/2008)   Hepatitis C Screening  05/17/2025 (Originally 12/29/1999)   HIV Screening  05/17/2025 (Originally 12/28/1996)   COVID-19 Vaccine (4 - 2025-26 season) 06/02/2025 (Originally 03/21/2024)   DTaP/Tdap/Td (5 - Td or Tdap) 04/07/2027   Influenza Vaccine  Completed   Meningococcal B Vaccine  Aged Out     ----------------------------------------------------------------------------------------------------------------------------------------------------------------------------------------------------------------- Physical Exam BP 112/73 (BP Location: Left Arm, Patient Position: Sitting, Cuff Size: Normal)   Pulse 77   Ht 5' 5 (1.651 m)   Wt 187 lb (84.8 kg)   SpO2 99%   BMI 31.12 kg/m   Physical Exam Constitutional:      Appearance: Normal appearance.  HENT:     Head: Normocephalic and atraumatic.  Neurological:     General: No focal deficit present.     Mental Status: She is alert.  Psychiatric:        Mood and Affect: Mood normal.        Behavior: Behavior normal.     ------------------------------------------------------------------------------------------------------------------------------------------------------------------------------------------------------------------- Assessment and Plan  Anxiety with depression She will be following along Trintellix  for about a week so unable to fully tell if this is helpful or not.  Will plan to continue this and follow-up in 2 to 3 months.  Obesity (BMI 30-39.9) Would like to try to lose weight so that she feels up to exercising again.  She will let me know if she wants to try a compounded tirzepatide.   No orders of the defined types were placed in this encounter.   Return in about 3 months (around 08/17/2024) for Mood/BH.

## 2024-05-17 NOTE — Assessment & Plan Note (Signed)
 Would like to try to lose weight so that she feels up to exercising again.  She will let me know if she wants to try a compounded tirzepatide.

## 2024-05-21 ENCOUNTER — Encounter: Payer: Self-pay | Admitting: Family Medicine

## 2024-05-23 MED ORDER — TIRZEPATIDE 10 MG/0.5ML ~~LOC~~ SOAJ: SUBCUTANEOUS | 5 refills | Status: DC

## 2024-06-03 DIAGNOSIS — J4521 Mild intermittent asthma with (acute) exacerbation: Secondary | ICD-10-CM | POA: Diagnosis not present

## 2024-06-03 DIAGNOSIS — J019 Acute sinusitis, unspecified: Secondary | ICD-10-CM | POA: Diagnosis not present

## 2024-08-18 ENCOUNTER — Encounter: Payer: Self-pay | Admitting: Family Medicine

## 2024-08-18 ENCOUNTER — Ambulatory Visit: Admitting: Family Medicine

## 2024-08-18 VITALS — BP 123/75 | HR 71 | Ht 65.0 in | Wt 183.0 lb

## 2024-08-18 DIAGNOSIS — F418 Other specified anxiety disorders: Secondary | ICD-10-CM | POA: Diagnosis not present

## 2024-08-18 DIAGNOSIS — E669 Obesity, unspecified: Secondary | ICD-10-CM | POA: Diagnosis not present

## 2024-08-18 MED ORDER — WEGOVY 4 MG PO TABS: 4.0000 mg | ORAL_TABLET | Freq: Every day | ORAL | 0 refills | Status: AC

## 2024-08-18 MED ORDER — WEGOVY 9 MG PO TABS: 9.0000 mg | ORAL_TABLET | Freq: Every day | ORAL | 0 refills | Status: AC

## 2024-08-18 MED ORDER — VORTIOXETINE HBR 10 MG PO TABS: ORAL_TABLET | ORAL | 5 refills | Status: AC

## 2024-08-18 MED ORDER — WEGOVY 1.5 MG PO TABS: 1.5000 mg | ORAL_TABLET | Freq: Every day | ORAL | 0 refills | Status: AC

## 2024-08-18 NOTE — Assessment & Plan Note (Signed)
 Continues on Trintellix  which she feels is working pretty well.  Will plan to continue at current strength.

## 2024-08-18 NOTE — Progress Notes (Signed)
 Samantha Ochoa - 44 y.o. female MRN [id number]  Date of birth: 06-20-1981  Subjective Chief Complaint  Patient presents with   Mood    HPI Samantha Ochoa is a 44 y.o. female here today for follow up visit.   She reports that she has stopped compounded tirzepatide .  Wasn't seeing a lot of improvement vs what she was paying for it.  She is interested in alternatives.  She did not have any significant side effects from taking tirzepatide .  She has been able to get back into running a little more.  Remains on trintellix  and feels that this is working pretty well for her.  No side effects at this time.  ROS:  A comprehensive ROS was completed and negative except as noted per HPI  Allergies[1]  Past Medical History:  Diagnosis Date   Anxiety    Asthma     Past Surgical History:  Procedure Laterality Date   BLADDER SURGERY  1991    Social History   Socioeconomic History   Marital status: Married    Spouse name: Not on file   Number of children: Not on file   Years of education: Not on file   Highest education level: Bachelor's degree (e.g., BA, AB, BS)  Occupational History   Not on file  Tobacco Use   Smoking status: Never   Smokeless tobacco: Never  Vaping Use   Vaping status: Never Used  Substance and Sexual Activity   Alcohol use: Yes    Alcohol/week: 1.0 standard drink of alcohol    Types: 1 Standard drinks or equivalent per week   Drug use: Never   Sexual activity: Yes    Birth control/protection: I.U.D.  Other Topics Concern   Not on file  Social History Narrative   Not on file   Social Drivers of Health   Tobacco Use: Low Risk (08/18/2024)   Patient History    Smoking Tobacco Use: Never    Smokeless Tobacco Use: Never    Passive Exposure: Not on file  Financial Resource Strain: Low Risk (09/26/2023)   Received from [facility]   Overall Financial Resource Strain (CARDIA)    Difficulty of Paying Living Expenses: Not hard at all   Food Insecurity: No Food Insecurity (09/26/2023)   Received from [facility]   Epic    Within the past 12 months, you worried that your food would run out before you got the money to buy more.: Never true    Within the past 12 months, the food you bought just didn't last and you didn't have money to get more.: Never true  Transportation Needs: No Transportation Needs (09/26/2023)   [facility] - Transportation    Lack of Transportation (Medical): No    Lack of Transportation (Non-Medical): No  Physical Activity: Sufficiently Active (09/26/2023)   Received from [facility]   Exercise Vital Sign    On average, how many days per week do you engage in moderate to strenuous exercise (like a brisk walk)?: 2 days    On average, how many minutes do you engage in exercise at this level?: 100 min  Stress: No Stress Concern Present (09/26/2023)   Received from [facility] Occupational Health - Occupational Stress Questionnaire    Feeling of Stress : Only a little  Social Connections: Socially Integrated (09/26/2023)   Received from [facility]   Social Network    How would you  rate your social network (family, work, friends)?: Good participation with social networks  Depression (PHQ2-9): Low Risk (08/18/2024)   Depression (PHQ2-9)    PHQ-2 Score: 2  Alcohol Screen: Low Risk (01/01/2023)   Alcohol Screen    Last Alcohol Screening Score (AUDIT): 2  Housing: Low Risk (09/26/2023)   Received from [facility]    In the last 12 months, was there a time when you were not able to pay the mortgage or rent on time?: No    In the past 12 months, how many times have you moved where you were living?: 0    At any time in the past 12 months, were you homeless or living in a shelter (including now)?: No  Utilities: Not At Risk (09/26/2023)   Received from [facility] Utilities    Threatened with loss of utilities: No  Health Literacy: Not on  file    Family History  Problem Relation Age of Onset   Breast cancer Mother    Hypertension Mother    Heart disease Father    Seizures Sister     Health Maintenance  Topic Date Due   Cervical Cancer Screening (HPV/Pap Cotest)  09/26/2020   Mammogram  06/13/2023   Pneumococcal Vaccine (1 of 2 - PCV) 05/17/2025 (Originally 12/28/2000)   Hepatitis B Vaccines 19-59 Average Risk (1 of 3 - 19+ 3-dose series) 05/17/2025 (Originally 12/28/2000)   Hepatitis C Screening  05/17/2025 (Originally 12/29/1999)   HIV Screening  05/17/2025 (Originally 12/28/1996)   COVID-19 Vaccine (4 - 2025-26 season) 06/02/2025 (Originally 03/21/2024)   DTaP/Tdap/Td (5 - Td or Tdap) 04/07/2027   Influenza Vaccine  Completed   HPV VACCINES (No Doses Required) Completed   Meningococcal B Vaccine  Aged Out     ----------------------------------------------------------------------------------------------------------------------------------------------------------------------------------------------------------------- Physical Exam BP 123/75   Pulse 71   Ht 5' 5 (1.651 m)   Wt 183 lb (83 kg)   SpO2 98%   BMI 30.45 kg/m   Physical Exam Constitutional:      Appearance: Normal appearance.  Eyes:     General: No scleral icterus. Cardiovascular:     Rate and Rhythm: Normal rate and regular rhythm.  Pulmonary:     Effort: Pulmonary effort is normal.     Breath sounds: Normal breath sounds.  Musculoskeletal:     Cervical back: Neck supple.  Neurological:     Mental Status: She is alert.  Psychiatric:        Mood and Affect: Mood normal.        Behavior: Behavior normal.     ------------------------------------------------------------------------------------------------------------------------------------------------------------------------------------------------------------------- Assessment and Plan  Anxiety with depression Continues on Trintellix  which she feels is working pretty well.  Will plan to  continue at current strength.  Obesity (BMI 30-39.9) she has discontinued tirzepatide .  She is interested in trying oral Wegovy .  Discussed current cash pricing if insurance does not cover.   Meds ordered this encounter  Medications   semaglutide -weight management (WEGOVY ) 1.5 MG tablet    Sig: Take 1 tablet (1.5 mg total) by mouth daily. Daily in AM on an empty stomach with 4 oz of water. Do not eat or drink for 30 minutes after dose.    Dispense:  30 tablet    Refill:  0   semaglutide -weight management (WEGOVY ) 4 MG tablet    Sig: Take 1 tablet (4 mg total) by mouth daily. Daily in morning on an empty stomach with 4 oz of water. Do not eat or  drink for 30 minutes after dose.    Dispense:  30 tablet    Refill:  0   semaglutide -weight management (WEGOVY ) 9 MG tablet    Sig: Take 1 tablet (9 mg total) by mouth daily. Daily in the morning on an empty stomach with 4 oz of water. Do not eat or drink for 30 minutes after dose.    Dispense:  30 tablet    Refill:  0   vortioxetine  HBr (TRINTELLIX ) 10 MG TABS tablet    Sig: Take 1/2 tab po daily x7 days then increase to a full tab    Dispense:  30 tablet    Refill:  5    Return in about 3 months (around 11/16/2024) for Mood/BH, weight management.         [1]  Allergies Allergen Reactions   Azithromycin Hives and Rash   Erythromycin Rash   Buspirone  Hcl Other (See Comments)    'felt weird'

## 2024-08-18 NOTE — Assessment & Plan Note (Signed)
 she has discontinued tirzepatide .  She is interested in trying oral Wegovy .  Discussed current cash pricing if insurance does not cover.

## 2024-08-19 ENCOUNTER — Telehealth: Payer: Self-pay | Admitting: Pharmacy Technician

## 2024-08-19 ENCOUNTER — Other Ambulatory Visit (HOSPITAL_COMMUNITY): Payer: Self-pay

## 2024-08-19 ENCOUNTER — Telehealth: Payer: Self-pay

## 2024-08-19 NOTE — Telephone Encounter (Signed)
 Pharmacy Patient Advocate Encounter   Received notification from [facility] that prior authorization for Wegovy  1.5 mg tablets is required/requested.   Insurance verification completed.   The patient is insured through [facility].   Per test claim: Per test claim, medication is not covered due to plan/benefit exclusion, PA not submitted at this time

## 2024-08-19 NOTE — Telephone Encounter (Signed)
 Copied from CRM #[id number]. Topic: Clinical - Medication Question >> Aug 19, 2024 11:42 AM Antony RAMAN wrote: Reason for CRM: patient said she found discount card for wegovy  and disregard her previous message didn't wanna bother him about it now

## 2024-08-19 NOTE — Telephone Encounter (Signed)
 Copied from CRM #[id number]. Topic: Clinical - Prescription Issue >> Aug 19, 2024 11:28 AM Joesph NOVAK wrote: Reason for CRM: patient would like to get assistance in lowering the cost for wegovy . She mentioned there is a coupon to lower it please advise.

## 2024-11-17 ENCOUNTER — Ambulatory Visit: Admitting: Family Medicine
